# Patient Record
Sex: Female | Born: 1960 | Race: White | Hispanic: No | Marital: Married | Smoking: Former smoker
Health system: Southern US, Community
[De-identification: ages and names within clinical notes are randomized; demographics above are authoritative.]

## PROBLEM LIST (undated history)

## (undated) DIAGNOSIS — R51 Headache: Secondary | ICD-10-CM

## (undated) DIAGNOSIS — E785 Hyperlipidemia, unspecified: Secondary | ICD-10-CM

## (undated) DIAGNOSIS — F32A Depression, unspecified: Secondary | ICD-10-CM

## (undated) DIAGNOSIS — R519 Headache, unspecified: Secondary | ICD-10-CM

## (undated) DIAGNOSIS — F329 Major depressive disorder, single episode, unspecified: Secondary | ICD-10-CM

## (undated) DIAGNOSIS — C801 Malignant (primary) neoplasm, unspecified: Secondary | ICD-10-CM

## (undated) DIAGNOSIS — T753XXA Motion sickness, initial encounter: Secondary | ICD-10-CM

## (undated) DIAGNOSIS — K219 Gastro-esophageal reflux disease without esophagitis: Secondary | ICD-10-CM

## (undated) DIAGNOSIS — J302 Other seasonal allergic rhinitis: Secondary | ICD-10-CM

## (undated) DIAGNOSIS — M1990 Unspecified osteoarthritis, unspecified site: Secondary | ICD-10-CM

## (undated) DIAGNOSIS — R229 Localized swelling, mass and lump, unspecified: Secondary | ICD-10-CM

## (undated) DIAGNOSIS — F40240 Claustrophobia: Secondary | ICD-10-CM

## (undated) HISTORY — PX: BREAST BIOPSY: SHX20

## (undated) HISTORY — PX: BREAST CYST EXCISION: SHX579

## (undated) HISTORY — PX: DILATION AND CURETTAGE OF UTERUS: SHX78

## (undated) HISTORY — PX: BREAST EXCISIONAL BIOPSY: SUR124

## (undated) HISTORY — PX: WISDOM TOOTH EXTRACTION: SHX21

---

## 2011-03-15 DIAGNOSIS — E669 Obesity, unspecified: Secondary | ICD-10-CM | POA: Insufficient documentation

## 2011-03-15 DIAGNOSIS — E7800 Pure hypercholesterolemia, unspecified: Secondary | ICD-10-CM | POA: Insufficient documentation

## 2013-12-30 DIAGNOSIS — F40298 Other specified phobia: Secondary | ICD-10-CM | POA: Insufficient documentation

## 2014-04-02 ENCOUNTER — Ambulatory Visit: Payer: Self-pay | Admitting: Unknown Physician Specialty

## 2014-07-22 ENCOUNTER — Other Ambulatory Visit (HOSPITAL_COMMUNITY): Payer: Self-pay | Admitting: Unknown Physician Specialty

## 2014-07-22 ENCOUNTER — Other Ambulatory Visit (HOSPITAL_COMMUNITY): Payer: Self-pay | Admitting: Orthopedic Surgery

## 2014-07-22 DIAGNOSIS — M898X2 Other specified disorders of bone, upper arm: Secondary | ICD-10-CM

## 2014-07-22 DIAGNOSIS — M79622 Pain in left upper arm: Secondary | ICD-10-CM

## 2014-09-16 ENCOUNTER — Encounter (HOSPITAL_COMMUNITY): Payer: Self-pay | Admitting: *Deleted

## 2014-09-16 ENCOUNTER — Encounter (HOSPITAL_COMMUNITY)
Admission: RE | Admit: 2014-09-16 | Discharge: 2014-09-16 | Disposition: A | Discharge status: Home / Self Care | Payer: Worker's Compensation | Source: Ambulatory Visit | Attending: Interventional Radiology | Admitting: Interventional Radiology

## 2014-09-16 DIAGNOSIS — Z87891 Personal history of nicotine dependence: Secondary | ICD-10-CM | POA: Diagnosis not present

## 2014-09-16 DIAGNOSIS — M79622 Pain in left upper arm: Secondary | ICD-10-CM | POA: Diagnosis not present

## 2014-09-23 ENCOUNTER — Ambulatory Visit (HOSPITAL_COMMUNITY): Payer: Worker's Compensation | Admitting: Certified Registered"

## 2014-09-23 ENCOUNTER — Encounter (HOSPITAL_COMMUNITY)
Admission: RE | Disposition: A | Discharge status: Home / Self Care | Payer: Self-pay | Source: Ambulatory Visit | Attending: *Deleted

## 2014-09-23 ENCOUNTER — Ambulatory Visit (HOSPITAL_COMMUNITY)
Admission: RE | Admit: 2014-09-23 | Discharge: 2014-09-23 | Disposition: A | Discharge status: Home / Self Care | Payer: Worker's Compensation | Source: Ambulatory Visit | Attending: Unknown Physician Specialty | Admitting: Unknown Physician Specialty

## 2014-09-23 ENCOUNTER — Ambulatory Visit (HOSPITAL_COMMUNITY)
Admission: RE | Admit: 2014-09-23 | Discharge: 2014-09-23 | Disposition: A | Discharge status: Home / Self Care | Payer: Worker's Compensation | Source: Ambulatory Visit | Attending: Anesthesiology | Admitting: Anesthesiology

## 2014-09-23 ENCOUNTER — Encounter (HOSPITAL_COMMUNITY): Payer: Self-pay | Admitting: *Deleted

## 2014-09-23 DIAGNOSIS — M79622 Pain in left upper arm: Secondary | ICD-10-CM | POA: Diagnosis not present

## 2014-09-23 DIAGNOSIS — Z87891 Personal history of nicotine dependence: Secondary | ICD-10-CM | POA: Diagnosis not present

## 2014-09-23 DIAGNOSIS — M898X2 Other specified disorders of bone, upper arm: Secondary | ICD-10-CM

## 2014-09-23 HISTORY — DX: Localized swelling, mass and lump, unspecified: R22.9

## 2014-09-23 HISTORY — PX: RADIOLOGY WITH ANESTHESIA: SHX6223

## 2014-09-23 HISTORY — DX: Headache: R51

## 2014-09-23 HISTORY — DX: Major depressive disorder, single episode, unspecified: F32.9

## 2014-09-23 HISTORY — DX: Gastro-esophageal reflux disease without esophagitis: K21.9

## 2014-09-23 HISTORY — DX: Headache, unspecified: R51.9

## 2014-09-23 HISTORY — DX: Depression, unspecified: F32.A

## 2014-09-23 SURGERY — RADIOLOGY WITH ANESTHESIA
Anesthesia: General

## 2014-09-23 MED ORDER — ARTIFICIAL TEARS OP OINT
TOPICAL_OINTMENT | OPHTHALMIC | Status: DC | PRN
  Administered 2014-09-23: 1 via OPHTHALMIC

## 2014-09-23 MED ORDER — LIDOCAINE HCL (CARDIAC) 20 MG/ML IV SOLN
INTRAVENOUS | Status: DC | PRN
Start: 2014-09-23 — End: 2014-09-23
  Administered 2014-09-23: 50 mg via INTRAVENOUS

## 2014-09-23 MED ORDER — GADOBENATE DIMEGLUMINE 529 MG/ML IV SOLN
15.0000 mL | Freq: Once | INTRAVENOUS | Status: AC | PRN
  Administered 2014-09-23: 15 mL via INTRAVENOUS

## 2014-09-23 MED ORDER — PHENYLEPHRINE HCL 10 MG/ML IJ SOLN
INTRAMUSCULAR | Status: DC | PRN
  Administered 2014-09-23: 120 ug via INTRAVENOUS
  Administered 2014-09-23: 80 ug via INTRAVENOUS
  Administered 2014-09-23: 120 ug via INTRAVENOUS
  Administered 2014-09-23: 80 ug via INTRAVENOUS

## 2014-09-23 MED ORDER — MIDAZOLAM HCL 5 MG/5ML IJ SOLN
INTRAMUSCULAR | Status: DC | PRN
  Administered 2014-09-23: 2 mg via INTRAVENOUS

## 2014-09-23 MED ORDER — ONDANSETRON HCL 4 MG/2ML IJ SOLN
INTRAMUSCULAR | Status: DC | PRN
  Administered 2014-09-23: 4 mg via INTRAVENOUS

## 2014-09-23 MED ORDER — PROPOFOL 10 MG/ML IV BOLUS
INTRAVENOUS | Status: DC | PRN
  Administered 2014-09-23: 200 mg via INTRAVENOUS

## 2014-09-23 MED ORDER — FENTANYL CITRATE 0.05 MG/ML IJ SOLN
INTRAMUSCULAR | Status: DC | PRN
  Administered 2014-09-23: 50 ug via INTRAVENOUS

## 2014-09-23 MED ORDER — LACTATED RINGERS IV SOLN
INTRAVENOUS | Status: DC | PRN
  Administered 2014-09-23: 07:00:00 via INTRAVENOUS

## 2014-09-23 MED ORDER — EPHEDRINE SULFATE 50 MG/ML IJ SOLN
INTRAMUSCULAR | Status: DC | PRN
  Administered 2014-09-23 (×2): 10 mg via INTRAVENOUS

## 2014-09-23 NOTE — Anesthesia Preprocedure Evaluation (Addendum)
Anesthesia Evaluation  Patient identified by MRN, date of birth, ID band Patient awake    Reviewed: Allergy & Precautions, NPO status , Patient's Chart, lab work & pertinent test results  Airway Mallampati: II  TM Distance: >3 FB     Dental  (+) Teeth Intact, Dental Advisory Given, Missing,    Pulmonary former smoker,  breath sounds clear to auscultation        Cardiovascular negative cardio ROS  Rhythm:Regular Rate:Normal     Neuro/Psych  Headaches,    GI/Hepatic Neg liver ROS, GERD-  Medicated and Controlled,  Endo/Other  negative endocrine ROS  Renal/GU negative Renal ROS     Musculoskeletal   Abdominal   Peds  Hematology negative hematology ROS (+)   Anesthesia Other Findings   Reproductive/Obstetrics                            Anesthesia Physical Anesthesia Plan  ASA: II  Anesthesia Plan: General   Post-op Pain Management:    Induction: Intravenous  Airway Management Planned: Oral ETT and LMA  Additional Equipment:   Intra-op Plan:   Post-operative Plan: Extubation in OR  Informed Consent: I have reviewed the patients History and Physical, chart, labs and discussed the procedure including the risks, benefits and alternatives for the proposed anesthesia with the patient or authorized representative who has indicated his/her understanding and acceptance.   Dental advisory given  Plan Discussed with: CRNA and Surgeon  Anesthesia Plan Comments:         Anesthesia Quick Evaluation

## 2014-09-23 NOTE — Transfer of Care (Signed)
Immediate Anesthesia Transfer of Care Note  Patient: Rebecca Cobb  Procedure(s) Performed: Procedure(s): MRI (N/A)  Patient Location: PACU  Anesthesia Type:General  Level of Consciousness: awake, alert  and oriented  Airway & Oxygen Therapy: Patient Spontanous Breathing and Patient connected to nasal cannula oxygen  Post-op Assessment: Report given to PACU RN  Post vital signs: Reviewed and stable  Complications: No apparent anesthesia complications

## 2014-09-23 NOTE — Discharge Instructions (Signed)
General Anesthesia, Adult, Care After  °Refer to this sheet in the next few weeks. These instructions provide you with information on caring for yourself after your procedure. Your health care provider may also give you more specific instructions. Your treatment has been planned according to current medical practices, but problems sometimes occur. Call your health care provider if you have any problems or questions after your procedure.  °WHAT TO EXPECT AFTER THE PROCEDURE  °After the procedure, it is typical to experience:  °Sleepiness.  °Nausea and vomiting. °HOME CARE INSTRUCTIONS  °For the first 24 hours after general anesthesia:  °Have a responsible person with you.  °Do not drive a car. If you are alone, do not take public transportation.  °Do not drink alcohol.  °Do not take medicine that has not been prescribed by your health care provider.  °Do not sign important papers or make important decisions.  °You may resume a normal diet and activities as directed by your health care provider.  °Change bandages (dressings) as directed.  °If you have questions or problems that seem related to general anesthesia, call the hospital and ask for the anesthetist or anesthesiologist on call. °SEEK MEDICAL CARE IF:  °You have nausea and vomiting that continue the day after anesthesia.  °You develop a rash. °SEEK IMMEDIATE MEDICAL CARE IF:  °You have difficulty breathing.  °You have chest pain.  °You have any allergic problems. °Document Released: 12/05/2000 Document Revised: 05/01/2013 Document Reviewed: 03/14/2013  °[employer]® Patient Information ©2014 [employer].  ° °What to eat: ° °For your first meals, you should eat lightly; only small meals initially.  If you do not have nausea, you may eat larger meals.  Avoid spicy, greasy and heavy food.   ° °

## 2014-09-23 NOTE — Anesthesia Procedure Notes (Signed)
Procedure Name: LMA Insertion Date/Time: 09/23/2014 7:56 AM Performed by: Sampson Si E Pre-anesthesia Checklist: Patient identified, Emergency Drugs available, Suction available, Patient being monitored and Timeout performed Patient Re-evaluated:Patient Re-evaluated prior to inductionOxygen Delivery Method: Circle system utilized Preoxygenation: Pre-oxygenation with 100% oxygen Intubation Type: IV induction Ventilation: Mask ventilation without difficulty LMA: LMA inserted LMA Size: 4.0 Number of attempts: 1 Placement Confirmation: positive ETCO2 and breath sounds checked- equal and bilateral Tube secured with: Tape Dental Injury: Teeth and Oropharynx as per pre-operative assessment

## 2014-09-23 NOTE — Anesthesia Postprocedure Evaluation (Signed)
  Anesthesia Post-op Note  Patient: Rebecca Cobb  Procedure(s) Performed: Procedure(s): MRI (N/A)  Patient Location: PACU  Anesthesia Type:General  Level of Consciousness: awake and alert   Airway and Oxygen Therapy: Patient Spontanous Breathing  Post-op Pain: none  Post-op Assessment: Post-op Vital signs reviewed  Post-op Vital Signs: stable  Last Vitals:  Filed Vitals:   09/23/14 0945  BP: 145/77  Pulse: 97  Temp:   Resp: 18    Complications: No apparent anesthesia complications

## 2014-09-24 ENCOUNTER — Encounter (HOSPITAL_COMMUNITY): Payer: Self-pay | Admitting: Radiology

## 2014-10-08 DIAGNOSIS — M7542 Impingement syndrome of left shoulder: Secondary | ICD-10-CM | POA: Insufficient documentation

## 2014-12-16 ENCOUNTER — Other Ambulatory Visit (HOSPITAL_COMMUNITY): Payer: Self-pay | Admitting: Unknown Physician Specialty

## 2014-12-16 DIAGNOSIS — M7542 Impingement syndrome of left shoulder: Secondary | ICD-10-CM

## 2015-01-05 ENCOUNTER — Encounter (HOSPITAL_COMMUNITY): Payer: Self-pay

## 2015-01-05 ENCOUNTER — Encounter (HOSPITAL_COMMUNITY)
Admission: RE | Admit: 2015-01-05 | Discharge: 2015-01-05 | Disposition: A | Discharge status: Home / Self Care | Payer: Worker's Compensation | Source: Ambulatory Visit | Attending: Unknown Physician Specialty | Admitting: Unknown Physician Specialty

## 2015-01-05 DIAGNOSIS — M7542 Impingement syndrome of left shoulder: Secondary | ICD-10-CM | POA: Diagnosis not present

## 2015-01-05 HISTORY — DX: Claustrophobia: F40.240

## 2015-01-05 LAB — CBC
HCT: 39.8 % (ref 36.0–46.0)
Hemoglobin: 13.5 g/dL (ref 12.0–15.0)
MCH: 31.1 pg (ref 26.0–34.0)
MCHC: 33.9 g/dL (ref 30.0–36.0)
MCV: 91.7 fL (ref 78.0–100.0)
Platelets: 212 10*3/uL (ref 150–400)
RBC: 4.34 MIL/uL (ref 3.87–5.11)
RDW: 13.2 % (ref 11.5–15.5)
WBC: 4.9 10*3/uL (ref 4.0–10.5)

## 2015-01-05 NOTE — Progress Notes (Signed)
   01/05/15 1105  OBSTRUCTIVE SLEEP APNEA  Have you ever been diagnosed with sleep apnea through a sleep study? No  Do you snore loudly (loud enough to be heard through closed doors)?  0  Do you often feel tired, fatigued, or sleepy during the daytime? 0  Has anyone observed you stop breathing during your sleep? 0  Do you have, or are you being treated for high blood pressure? 0  BMI more than 35 kg/m2? 0  Age over 54 years old? 1  Neck circumference greater than 40 cm/16 inches? 0  Gender: 0

## 2015-01-05 NOTE — Pre-Procedure Instructions (Signed)
Rebecca Cobb  01/05/2015   Your procedure is scheduled on:  Thursday, April 28  Report to [facility] Admitting at 0600 AM.  Call this number if you have problems the morning of surgery: [phone]   Remember:   Do not eat food or drink liquids after midnight. Wednesday night   Take these medicines the morning of surgery with A SIP OF WATER: pain medication if needed   Do not wear jewelry, make-up or nail polish.  Do not wear lotions, powders, or perfumes. You may wear deodorant.  Do not shave 48 hours prior to surgery.    Do not bring valuables to the hospital.  [facility] is not responsible   for any belongings or valuables.               Contacts, dentures or bridgework may not be worn into surgery.                     Patients discharged the day of surgery will not be allowed to drive  home.  Name and phone number of your driver: spouse, Rebecca Cobb [phone]      Please read over the following fact sheets that you were given: Pain Booklet, Coughing and Deep Breathing and Surgical Site Infection Prevention

## 2015-01-08 ENCOUNTER — Ambulatory Visit (HOSPITAL_COMMUNITY): Payer: Worker's Compensation | Admitting: Certified Registered"

## 2015-01-08 ENCOUNTER — Encounter (HOSPITAL_COMMUNITY): Payer: Self-pay | Admitting: *Deleted

## 2015-01-08 ENCOUNTER — Ambulatory Visit (HOSPITAL_COMMUNITY)
Admission: RE | Admit: 2015-01-08 | Discharge: 2015-01-08 | Disposition: A | Discharge status: Home / Self Care | Payer: Worker's Compensation | Source: Ambulatory Visit | Attending: Unknown Physician Specialty | Admitting: Unknown Physician Specialty

## 2015-01-08 ENCOUNTER — Encounter (HOSPITAL_COMMUNITY)
Admission: RE | Disposition: A | Discharge status: Home / Self Care | Payer: Self-pay | Source: Ambulatory Visit | Attending: Unknown Physician Specialty

## 2015-01-08 DIAGNOSIS — M7542 Impingement syndrome of left shoulder: Secondary | ICD-10-CM | POA: Diagnosis present

## 2015-01-08 HISTORY — PX: RADIOLOGY WITH ANESTHESIA: SHX6223

## 2015-01-08 SURGERY — RADIOLOGY WITH ANESTHESIA
Anesthesia: General | Laterality: Left

## 2015-01-08 MED ORDER — PROPOFOL 10 MG/ML IV BOLUS
INTRAVENOUS | Status: DC | PRN
  Administered 2015-01-08: 150 mg via INTRAVENOUS

## 2015-01-08 MED ORDER — LIDOCAINE HCL (CARDIAC) 20 MG/ML IV SOLN
INTRAVENOUS | Status: DC | PRN
  Administered 2015-01-08: 100 mg via INTRAVENOUS

## 2015-01-08 MED ORDER — LACTATED RINGERS IV SOLN
INTRAVENOUS | Status: DC | PRN
  Administered 2015-01-08: 08:00:00 via INTRAVENOUS

## 2015-01-08 MED ORDER — MIDAZOLAM HCL 5 MG/5ML IJ SOLN
INTRAMUSCULAR | Status: DC | PRN
  Administered 2015-01-08 (×2): 2 mg via INTRAVENOUS

## 2015-01-08 NOTE — Anesthesia Procedure Notes (Signed)
Procedure Name: LMA Insertion Date/Time: 01/08/2015 8:01 AM Performed by: Melina Copa, Mavis Gravelle R Pre-anesthesia Checklist: Patient identified, Emergency Drugs available, Suction available, Patient being monitored and Timeout performed Patient Re-evaluated:Patient Re-evaluated prior to inductionOxygen Delivery Method: Circle system utilized Preoxygenation: Pre-oxygenation with 100% oxygen Intubation Type: IV induction Ventilation: Mask ventilation without difficulty LMA: LMA inserted LMA Size: 4.0 Placement Confirmation: positive ETCO2 Tube secured with: Tape Dental Injury: Teeth and Oropharynx as per pre-operative assessment

## 2015-01-08 NOTE — Anesthesia Preprocedure Evaluation (Signed)
Anesthesia Evaluation  Patient identified by MRN, date of birth, ID band Patient awake    Reviewed: Allergy & Precautions, NPO status , Patient's Chart, lab work & pertinent test results  Airway Mallampati: II  TM Distance: >3 FB     Dental  (+) Teeth Intact, Dental Advisory Given, Missing   Pulmonary former smoker,  breath sounds clear to auscultation        Cardiovascular negative cardio ROS  Rhythm:Regular Rate:Normal     Neuro/Psych  Headaches,    GI/Hepatic Neg liver ROS, GERD-  Medicated and Controlled,  Endo/Other  negative endocrine ROS  Renal/GU      Musculoskeletal   Abdominal   Peds  Hematology negative hematology ROS (+)   Anesthesia Other Findings   Reproductive/Obstetrics                             Anesthesia Physical Anesthesia Plan  ASA: I  Anesthesia Plan: General LMA   Post-op Pain Management:    Induction: Intravenous  Airway Management Planned: LMA  Additional Equipment:   Intra-op Plan:   Post-operative Plan: Extubation in OR  Informed Consent: I have reviewed the patients History and Physical, chart, labs and discussed the procedure including the risks, benefits and alternatives for the proposed anesthesia with the patient or authorized representative who has indicated his/her understanding and acceptance.   Dental advisory given  Plan Discussed with: CRNA and Surgeon  Anesthesia Plan Comments:         Anesthesia Quick Evaluation

## 2015-01-08 NOTE — Transfer of Care (Signed)
Immediate Anesthesia Transfer of Care Note  Patient: Rebecca Cobb  Procedure(s) Performed: Procedure(s) with comments: RADIOLOGY WITH ANESTHESIA/LEFT SHOULDER MRI (Left) - DR. KERNODLE/MRI  Patient Location: PACU  Anesthesia Type:General  Level of Consciousness: awake  Airway & Oxygen Therapy: Patient Spontanous Breathing  Post-op Assessment: Report given to RN, Post -op Vital signs reviewed and stable and Patient moving all extremities  Post vital signs: Reviewed and stable  Last Vitals:  Filed Vitals:   01/08/15 0926  BP: 125/83  Pulse: 66  Temp: 36.7 C  Resp: 14    Complications: No apparent anesthesia complications

## 2015-01-08 NOTE — Anesthesia Postprocedure Evaluation (Signed)
  Anesthesia Post-op Note  Patient: Rebecca Cobb  Procedure(s) Performed: Procedure(s) with comments: RADIOLOGY WITH ANESTHESIA/LEFT SHOULDER MRI (Left) - DR. KERNODLE/MRI  Patient Location: PACU  Anesthesia Type:General  Level of Consciousness: awake and alert   Airway and Oxygen Therapy: Patient Spontanous Breathing  Post-op Pain: none  Post-op Assessment: Post-op Vital signs reviewed  Post-op Vital Signs: stable  Last Vitals:  Filed Vitals:   01/08/15 0926  BP: 125/83  Pulse: 66  Temp: 36.7 C  Resp: 14    Complications: No apparent anesthesia complications

## 2015-01-09 ENCOUNTER — Encounter (HOSPITAL_COMMUNITY): Payer: Self-pay | Admitting: Radiology

## 2015-01-21 DIAGNOSIS — M75112 Incomplete rotator cuff tear or rupture of left shoulder, not specified as traumatic: Secondary | ICD-10-CM | POA: Insufficient documentation

## 2015-03-02 ENCOUNTER — Encounter: Payer: Self-pay | Admitting: *Deleted

## 2015-03-06 ENCOUNTER — Encounter: Payer: Self-pay | Admitting: *Deleted

## 2015-03-06 ENCOUNTER — Ambulatory Visit: Payer: Worker's Compensation | Admitting: Anesthesiology

## 2015-03-06 ENCOUNTER — Encounter
Admission: RE | Disposition: A | Discharge status: Home / Self Care | Payer: Self-pay | Source: Ambulatory Visit | Attending: Unknown Physician Specialty

## 2015-03-06 ENCOUNTER — Ambulatory Visit
Admission: RE | Admit: 2015-03-06 | Discharge: 2015-03-06 | Disposition: A | Discharge status: Home / Self Care | Payer: Worker's Compensation | Source: Ambulatory Visit | Attending: Unknown Physician Specialty | Admitting: Unknown Physician Specialty

## 2015-03-06 DIAGNOSIS — Z8249 Family history of ischemic heart disease and other diseases of the circulatory system: Secondary | ICD-10-CM | POA: Diagnosis not present

## 2015-03-06 DIAGNOSIS — Z823 Family history of stroke: Secondary | ICD-10-CM | POA: Insufficient documentation

## 2015-03-06 DIAGNOSIS — E669 Obesity, unspecified: Secondary | ICD-10-CM | POA: Insufficient documentation

## 2015-03-06 DIAGNOSIS — M75112 Incomplete rotator cuff tear or rupture of left shoulder, not specified as traumatic: Secondary | ICD-10-CM | POA: Diagnosis present

## 2015-03-06 DIAGNOSIS — Z8042 Family history of malignant neoplasm of prostate: Secondary | ICD-10-CM | POA: Insufficient documentation

## 2015-03-06 DIAGNOSIS — M7542 Impingement syndrome of left shoulder: Secondary | ICD-10-CM | POA: Diagnosis not present

## 2015-03-06 DIAGNOSIS — E785 Hyperlipidemia, unspecified: Secondary | ICD-10-CM | POA: Insufficient documentation

## 2015-03-06 DIAGNOSIS — Z885 Allergy status to narcotic agent status: Secondary | ICD-10-CM | POA: Insufficient documentation

## 2015-03-06 DIAGNOSIS — F329 Major depressive disorder, single episode, unspecified: Secondary | ICD-10-CM | POA: Diagnosis not present

## 2015-03-06 DIAGNOSIS — Z808 Family history of malignant neoplasm of other organs or systems: Secondary | ICD-10-CM | POA: Insufficient documentation

## 2015-03-06 DIAGNOSIS — M7522 Bicipital tendinitis, left shoulder: Secondary | ICD-10-CM | POA: Insufficient documentation

## 2015-03-06 DIAGNOSIS — Z8489 Family history of other specified conditions: Secondary | ICD-10-CM | POA: Diagnosis not present

## 2015-03-06 DIAGNOSIS — Z87891 Personal history of nicotine dependence: Secondary | ICD-10-CM | POA: Diagnosis not present

## 2015-03-06 DIAGNOSIS — Z6831 Body mass index (BMI) 31.0-31.9, adult: Secondary | ICD-10-CM | POA: Diagnosis not present

## 2015-03-06 DIAGNOSIS — Z79899 Other long term (current) drug therapy: Secondary | ICD-10-CM | POA: Diagnosis not present

## 2015-03-06 HISTORY — DX: Motion sickness, initial encounter: T75.3XXA

## 2015-03-06 HISTORY — PX: SHOULDER ARTHROSCOPY WITH OPEN ROTATOR CUFF REPAIR AND DISTAL CLAVICLE ACROMINECTOMY: SHX5683

## 2015-03-06 SURGERY — SHOULDER ARTHROSCOPY WITH OPEN ROTATOR CUFF REPAIR AND DISTAL CLAVICLE ACROMINECTOMY
Anesthesia: General | Laterality: Left | Wound class: Clean

## 2015-03-06 MED ORDER — PROPOFOL 10 MG/ML IV BOLUS
INTRAVENOUS | Status: DC | PRN
  Administered 2015-03-06: 160 mg via INTRAVENOUS

## 2015-03-06 MED ORDER — LACTATED RINGERS IV SOLN
INTRAVENOUS | Status: DC | PRN
  Administered 2015-03-06: 9100 mL

## 2015-03-06 MED ORDER — GLYCOPYRROLATE 0.2 MG/ML IJ SOLN
INTRAMUSCULAR | Status: DC | PRN
  Administered 2015-03-06: 0.1 mg via INTRAVENOUS

## 2015-03-06 MED ORDER — CEFAZOLIN SODIUM 1-5 GM-% IV SOLN
INTRAVENOUS | Status: DC | PRN
  Administered 2015-03-06: 1 g via INTRAVENOUS

## 2015-03-06 MED ORDER — MIDAZOLAM HCL 5 MG/5ML IJ SOLN
INTRAMUSCULAR | Status: DC | PRN
  Administered 2015-03-06 (×3): 2 mg via INTRAVENOUS

## 2015-03-06 MED ORDER — ONDANSETRON HCL 4 MG/2ML IJ SOLN
INTRAMUSCULAR | Status: DC | PRN
  Administered 2015-03-06: 4 mg via INTRAVENOUS

## 2015-03-06 MED ORDER — SCOPOLAMINE 1 MG/3DAYS TD PT72
1.0000 | MEDICATED_PATCH | TRANSDERMAL | Status: DC
Start: 2015-03-06 — End: 2015-03-06
  Administered 2015-03-06: 1.5 mg via TRANSDERMAL

## 2015-03-06 MED ORDER — ROPIVACAINE HCL 5 MG/ML IJ SOLN
INTRAMUSCULAR | Status: DC | PRN
  Administered 2015-03-06: 30 mL via PERINEURAL

## 2015-03-06 MED ORDER — FENTANYL CITRATE (PF) 100 MCG/2ML IJ SOLN
INTRAMUSCULAR | Status: DC | PRN
  Administered 2015-03-06: 100 ug via INTRAVENOUS

## 2015-03-06 MED ORDER — DEXAMETHASONE SODIUM PHOSPHATE 4 MG/ML IJ SOLN
INTRAMUSCULAR | Status: DC | PRN
  Administered 2015-03-06: 4 mg via PERINEURAL
  Administered 2015-03-06: 4 mg via INTRAVENOUS

## 2015-03-06 MED ORDER — LACTATED RINGERS IV SOLN
INTRAVENOUS | Status: DC
  Administered 2015-03-06: 08:00:00 via INTRAVENOUS

## 2015-03-06 MED ORDER — LIDOCAINE HCL (CARDIAC) 20 MG/ML IV SOLN
INTRAVENOUS | Status: DC | PRN
  Administered 2015-03-06: 50 mg via INTRATRACHEAL

## 2015-03-06 SURGICAL SUPPLY — 71 items
ADAPTER IRRIG TUBE 2 SPIKE SOL (ADAPTER) ×4 IMPLANT
ARTHROWAND PARAGON T2 (SURGICAL WAND)
BLADE SURG 15 STRL LF DISP TIS (BLADE) IMPLANT
BLADE SURG 15 STRL SS (BLADE)
BUR BR 5.5 12 FLUTE (BURR) ×2 IMPLANT
BUR HOODED 3.0 ABRASION (BURR) IMPLANT
BUR RADIUS 4.0X18.5 (BURR) ×2 IMPLANT
BUR RADIUS 5.5 (BURR) IMPLANT
CANNULA 8.5X75 THRED (CANNULA) ×2 IMPLANT
CANNULA SHAVER 8MMX76MM (CANNULA) IMPLANT
CAP LOCK ULTRA CANNULA (MISCELLANEOUS) IMPLANT
CUTTER CANN W/HOLE 4.5 (CUTTER) IMPLANT
CUTTER SLOTTED WHISKER 4.0 (BURR) IMPLANT
DRAPE STERI 35X30 U-POUCH (DRAPES) ×2 IMPLANT
DURAPREP 26ML APPLICATOR (WOUND CARE) ×4 IMPLANT
GAUZE SPONGE 4X4 12PLY STRL (GAUZE/BANDAGES/DRESSINGS) ×2 IMPLANT
GLOVE BIO SURGEON STRL SZ7.5 (GLOVE) ×6 IMPLANT
GLOVE BIO SURGEON STRL SZ8 (GLOVE) ×2 IMPLANT
GLOVE INDICATOR 8.0 STRL GRN (GLOVE) ×4 IMPLANT
GOWN STRL REIN 2XL LVL4 (GOWN DISPOSABLE) IMPLANT
GOWN STRL REUS W/ TWL LRG LVL3 (GOWN DISPOSABLE) ×3 IMPLANT
GOWN STRL REUS W/TWL LRG LVL3 (GOWN DISPOSABLE) ×3
IV LACTATED RINGER IRRG 3000ML (IV SOLUTION) ×4
IV LR IRRIG 3000ML ARTHROMATIC (IV SOLUTION) ×4 IMPLANT
KIT SHOULDER TRACTION (DRAPES) ×2 IMPLANT
MANIFOLD 4PT FOR NEPTUNE1 (MISCELLANEOUS) ×2 IMPLANT
NEEDLE 18GX1X1/2 (RX/OR ONLY) (NEEDLE) ×2 IMPLANT
NEEDLE MAYO CATGUT SZ 1.5 (NEEDLE)
NEEDLE MAYO CATGUT SZ 2 (NEEDLE) IMPLANT
NEEDLE SPNL 18GX3.5 QUINCKE PK (NEEDLE) IMPLANT
PACK ARTHROSCOPY SHOULDER (MISCELLANEOUS) ×2 IMPLANT
PAD GROUND ADULT SPLIT (MISCELLANEOUS) IMPLANT
PASSER SUT CAPTURE FIRST (SUTURE) IMPLANT
SET TUBE SUCT SHAVER OUTFL 24K (TUBING) ×2 IMPLANT
SOL PREP PVP 2OZ (MISCELLANEOUS) ×2
SOLUTION PREP PVP 2OZ (MISCELLANEOUS) ×1 IMPLANT
STAPLER SKIN PROX 35W (STAPLE) IMPLANT
SUT ETHIBOND NAB CT1 #1 30IN (SUTURE) IMPLANT
SUT ETHILON 3-0 FS-10 30 BLK (SUTURE) ×2
SUT MAGNUM WIRE 2 (SUTURE) IMPLANT
SUT PDS AB 1 CT1 27 (SUTURE) IMPLANT
SUT PDSII 0 (SUTURE) IMPLANT
SUT PERFECTPASSER WHITE CART (SUTURE) IMPLANT
SUT PROLENE 2 0 CT2 30 (SUTURE) IMPLANT
SUT SMART STITCH CARTRIDGE (SUTURE) IMPLANT
SUT TICRON 2-0 30IN 311381 (SUTURE) IMPLANT
SUT VIC AB 0 CT1 36 (SUTURE) IMPLANT
SUT VIC AB 0 CT2 27 (SUTURE) IMPLANT
SUT VIC AB 2-0 CT1 27 (SUTURE)
SUT VIC AB 2-0 CT1 TAPERPNT 27 (SUTURE) IMPLANT
SUT VIC AB 2-0 CT2 27 (SUTURE) IMPLANT
SUT VIC AB 2-0 SH 27 (SUTURE)
SUT VIC AB 2-0 SH 27XBRD (SUTURE) IMPLANT
SUT VIC AB 3-0 SH 27 (SUTURE)
SUT VIC AB 3-0 SH 27X BRD (SUTURE) IMPLANT
SUTURE EHLN 3-0 FS-10 30 BLK (SUTURE) ×1 IMPLANT
SUTURE MAGNUM WIRE 2X48 BLK (SUTURE) IMPLANT
SYRINGE 10CC LL (SYRINGE) ×2 IMPLANT
TAPE MICROFOAM 4IN (TAPE) ×2 IMPLANT
TUBING ARTHRO INFLOW-ONLY STRL (TUBING) ×2 IMPLANT
WAND 30 DEG SABER W/CORD (SURGICAL WAND) IMPLANT
WAND ARTHRO PARAGON T2 (SURGICAL WAND) IMPLANT
WAND COVAC 50 IFS (MISCELLANEOUS) IMPLANT
WAND COVATOR 20 (MISCELLANEOUS) IMPLANT
WAND HAND CNTRL MULTIVAC 50 (MISCELLANEOUS) IMPLANT
WAND HAND CNTRL MULTIVAC 90 (MISCELLANEOUS) IMPLANT
WAND MEGAVAC 90 (MISCELLANEOUS) ×2 IMPLANT
WAND TENDON TOPAZ 0 ANGL (MISCELLANEOUS) IMPLANT
WAND TOPAZ EPF  WAS Q (MISCELLANEOUS)
WAND TOPAZ EPF WAS Q (MISCELLANEOUS) IMPLANT
WIRE MAGNUM (SUTURE) IMPLANT

## 2015-03-06 NOTE — Anesthesia Postprocedure Evaluation (Signed)
  Anesthesia Post-op Note  Patient: Rebecca Cobb  Procedure(s) Performed: Procedure(s): SHOULDER ARTHROSCOPY WITH RELEASE OF LONG HEAD OF BICEPS TENDON AND DECOMPRESSION (Left)  Anesthesia type:General  Patient location: PACU  Post pain: Pain level controlled  Post assessment: Post-op Vital signs reviewed, Patient's Cardiovascular Status Stable, Respiratory Function Stable, Patent Airway and No signs of Nausea or vomiting  Post vital signs: Reviewed and stable  Last Vitals:  Filed Vitals:   03/06/15 1043  BP: 110/73  Pulse: 66  Temp: 36.5 C  Resp: 16    Level of consciousness: awake, alert  and patient cooperative  Complications: No apparent anesthesia complications

## 2015-03-06 NOTE — Anesthesia Preprocedure Evaluation (Signed)
Anesthesia Evaluation  Patient identified by MRN, date of birth, ID band Patient awake    Reviewed: Allergy & Precautions, H&P , NPO status , Patient's Chart, lab work & pertinent test results, reviewed documented beta blocker date and time   Airway Mallampati: II  TM Distance: >3 FB Neck ROM: full    Dental no notable dental hx.    Pulmonary former smoker,  breath sounds clear to auscultation  Pulmonary exam normal       Cardiovascular Exercise Tolerance: Good negative cardio ROS  Rhythm:regular Rate:Normal     Neuro/Psych  Headaches, PSYCHIATRIC DISORDERS    GI/Hepatic Neg liver ROS, GERD-  Medicated and Controlled,  Endo/Other  negative endocrine ROS  Renal/GU negative Renal ROS  negative genitourinary   Musculoskeletal   Abdominal   Peds  Hematology negative hematology ROS (+)   Anesthesia Other Findings   Reproductive/Obstetrics negative OB ROS                             Anesthesia Physical Anesthesia Plan  ASA: II  Anesthesia Plan: General   Post-op Pain Management:    Induction:   Airway Management Planned:   Additional Equipment:   Intra-op Plan:   Post-operative Plan:   Informed Consent: I have reviewed the patients History and Physical, chart, labs and discussed the procedure including the risks, benefits and alternatives for the proposed anesthesia with the patient or authorized representative who has indicated his/her understanding and acceptance.     Plan Discussed with: CRNA  Anesthesia Plan Comments:         Anesthesia Quick Evaluation

## 2015-03-06 NOTE — H&P (Signed)
  H and P reviewed. No changes. Uploaded at later date. 

## 2015-03-06 NOTE — Anesthesia Procedure Notes (Addendum)
Anesthesia Regional Block:  Interscalene brachial plexus block  Pre-Anesthetic Checklist: ,, timeout performed, Correct Patient, Correct Site, Correct Laterality, Correct Procedure, Correct Position, site marked, Risks and benefits discussed,  Surgical consent,  Pre-op evaluation,  At surgeon's request and post-op pain management  Laterality: Left  Prep: chloraprep       Needles:  Injection technique: Single-shot     Needle Length: 4cm 4 cm Needle Gauge: 21 and 21 G    Additional Needles:  Procedures: ultrasound guided (picture in chart) Interscalene brachial plexus block Narrative:  Start time: 03/06/2015 8:27 AM End time: 03/06/2015 8:36 AM Injection made incrementally with aspirations every 30 mL.  Performed by: Personally  Anesthesiologist: Marchia Bond D   Procedure Name: LMA Insertion Date/Time: 03/06/2015 9:21 AM Performed by: Mayme Genta Pre-anesthesia Checklist: Patient identified, Emergency Drugs available, Suction available, Timeout performed and Patient being monitored Patient Re-evaluated:Patient Re-evaluated prior to inductionOxygen Delivery Method: Circle system utilized Preoxygenation: Pre-oxygenation with 100% oxygen Intubation Type: IV induction LMA: LMA inserted LMA Size: 4.0 Number of attempts: 1 Placement Confirmation: positive ETCO2 and breath sounds checked- equal and bilateral Tube secured with: Tape

## 2015-03-06 NOTE — Op Note (Signed)
03/06/2015  10:58 AM  Patient:   Rebecca Cobb  Pre-Op Diagnosis:   PARTIAL THICKNESS INFRASPINATUS CUFF TEAR LEFT M75.112  Postoperative diagnosis: Traumatic impingement left shoulder with bicipital tendinitis   Procedure: Arthroscopic release of the long head of biceps tendon followed by arthroscopic subacromial decompression left shoulder.  Anesthesia: General endotracheal with interscalene block placed preoperatively by the anesthesiologist.  Findings: As above.   Complications: None  Estimated blood loss: negligable  Tourniquet time: None  Drains: None     Brief clinical note:  The patient's symptoms have progressed despite medications, activity modification, etc. The patient's history and examination are consistent with impingement left shoulder with possible small rotator cuff tear. The MRI  was consistent with a possible small cuff tear. The patient presents at this time for definitive management of these shoulder symptoms.  Procedure: The patient was brought into the operating room and placed in the supine position. The patient then underwent general endotracheal intubation and anesthesia before being repositioned in the lateral decubitus position. The left shoulder and upper extremity were prepped and draped in usual fashion. Preoperative antibiotics were administered. A timeout was performed . A posterior portal was created and the glenohumeral joint thoroughly inspected revealing no significant degenerative changes. No obvious articular sided rotator cuff tear was appreciated. The labrum was intact but there was some fraying near the long head of the biceps tendon attachment to the labrum.  An anterior portal was created.   The ArthroCare wand was inserted and used to obtain hemostasis as well as to perform a limited synovectomy.The biceps tendon was evaluated and then released at its attachment to the labrum using the ArthroCare wand.  The scope was  repositioned through the posterior portal into the subacromial space. A separate lateral portal was created using an outside-in technique. The patient was noted to have significantly thickened subacromial bursal tissue. An ArthroCare 90 wand followed by a 3.5 full-radius resector was introduced and used to perform a subtotal bursectomy. The ArthroCare wand was then inserted and used to remove the periosteal tissue off the undersurface of the anterior third of the acromion as well as to recess the coracoacromial ligament from its attachment along the anterior and lateral margins of the acromion.   With the scope in the lateral portal a 5.51mm acromionizing bur was introduced through the posterior portal and used to perform the decompression by removing the undersurface of the anterior third of the acromion  The portal sites  were closed using 4-O nylon sutures. A sterile bulky dressing was applied to the shoulder before the arm was placed into a shoulder immobilizer. The patient was then awakened, extubated, and returned to the recovery room in satisfactory condition after tolerating the procedure well.  Blood loss was negligible.

## 2015-03-06 NOTE — Transfer of Care (Signed)
Immediate Anesthesia Transfer of Care Note  Patient: Rebecca Cobb  Procedure(s) Performed: Procedure(s): SHOULDER ARTHROSCOPY WITH RELEASE OF LONG HEAD OF BICEPS TENDON AND DECOMPRESSION (Left)  Patient Location: PACU  Anesthesia Type: General  Level of Consciousness: awake, alert  and patient cooperative  Airway and Oxygen Therapy: Patient Spontanous Breathing and Patient connected to supplemental oxygen  Post-op Assessment: Post-op Vital signs reviewed, Patient's Cardiovascular Status Stable, Respiratory Function Stable, Patent Airway and No signs of Nausea or vomiting  Post-op Vital Signs: Reviewed and stable  Complications: No apparent anesthesia complications

## 2015-03-06 NOTE — Progress Notes (Signed)
Assisted Marchia Bond ANMD with left, interscalene  block. Side rails up, monitors on throughout procedure. See vital signs in flow sheet. Tolerated Procedure well.

## 2015-03-06 NOTE — Discharge Instructions (Addendum)
°  General Anesthesia, Care After Refer to this sheet in the next few weeks. These instructions provide you with information on caring for yourself after your procedure. Your health care provider may also give you more specific instructions. Your treatment has been planned according to current medical practices, but problems sometimes occur. Call your health care provider if you have any problems or questions after your procedure. WHAT TO EXPECT AFTER THE PROCEDURE After the procedure, it is typical to experience:  Sleepiness.  Nausea and vomiting. HOME CARE INSTRUCTIONS  For the first 24 hours after general anesthesia:  Have a responsible person with you.  Do not drive a car. If you are alone, do not take public transportation.  Do not drink alcohol.  Do not take medicine that has not been prescribed by your health care provider.  Do not sign important papers or make important decisions.  You may resume a normal diet and activities as directed by your health care provider.  Change bandages (dressings) as directed.  If you have questions or problems that seem related to general anesthesia, call the hospital and ask for the anesthetist or anesthesiologist on call. SEEK MEDICAL CARE IF:  You have nausea and vomiting that continue the day after anesthesia.  You develop a rash. SEEK IMMEDIATE MEDICAL CARE IF:   You have difficulty breathing.  You have chest pain.  You have any allergic problems.  Document Released: 12/05/2000 Document Revised: 09/03/2013 Document Reviewed: 03/14/2013 [facility] Patient Information 2015 [place], [place]. This information is not intended to replace advice given to you by your health care provider. Make sure you discuss any questions you have with your health care provider. Diet: As you were doing prior to hospitalization   Shower:  May shower but keep the wounds dry, use an occlusive plastic wrap or extremity protector. NO SOAKING IN TUB.    Dressing:  You may remove your dressing 3 days after surgery. Then apply waterproof bandaids and change them after showering.   Activity:  Increase activity slowly as tolerated. Can drive when comfortable.    Sling: D/C when comfortable    RTC: 1 week  Ice pack as needed   To prevent constipation: you may use a stool softener such as - Miralax (over the counter) for constipation as needed.    To prevent venous clotting Take one 81 mg. ASA tablet  2X per day for about 2 weeks post surgery.  Precautions:  If you experience chest pain or shortness of breath - call 911 immediately for transfer to the hospital emergency department!!  If you develop a fever greater that 101 F, purulent drainage from wound, increased redness or drainage from wound, or calf pain -- Call the office at [phone]                                             Follow- Up Appointment:  Please call for an appointment to be seen in 1 wk.

## 2015-03-09 ENCOUNTER — Encounter: Payer: Self-pay | Admitting: Unknown Physician Specialty

## 2015-07-29 DIAGNOSIS — IMO0001 Reserved for inherently not codable concepts without codable children: Secondary | ICD-10-CM | POA: Insufficient documentation

## 2015-10-23 ENCOUNTER — Other Ambulatory Visit (HOSPITAL_COMMUNITY): Payer: Self-pay | Admitting: Orthopedic Surgery

## 2015-10-23 DIAGNOSIS — M75102 Unspecified rotator cuff tear or rupture of left shoulder, not specified as traumatic: Secondary | ICD-10-CM

## 2015-11-02 NOTE — Progress Notes (Signed)
Spoke with Velna Hatchet and requested that H&P be faxed over last office visit 10/18/15

## 2015-11-05 ENCOUNTER — Encounter (HOSPITAL_COMMUNITY): Payer: Self-pay

## 2015-11-05 ENCOUNTER — Encounter (HOSPITAL_COMMUNITY)
Admission: RE | Admit: 2015-11-05 | Discharge: 2015-11-05 | Disposition: A | Discharge status: Home / Self Care | Payer: Worker's Compensation | Source: Ambulatory Visit | Attending: Orthopedic Surgery | Admitting: Orthopedic Surgery

## 2015-11-05 DIAGNOSIS — M75102 Unspecified rotator cuff tear or rupture of left shoulder, not specified as traumatic: Secondary | ICD-10-CM | POA: Diagnosis not present

## 2015-11-05 DIAGNOSIS — Z01812 Encounter for preprocedural laboratory examination: Secondary | ICD-10-CM | POA: Diagnosis not present

## 2015-11-05 LAB — CBC
HEMATOCRIT: 40.3 % (ref 36.0–46.0)
HEMOGLOBIN: 13.6 g/dL (ref 12.0–15.0)
MCH: 31.6 pg (ref 26.0–34.0)
MCHC: 33.7 g/dL (ref 30.0–36.0)
MCV: 93.5 fL (ref 78.0–100.0)
Platelets: 199 10*3/uL (ref 150–400)
RBC: 4.31 MIL/uL (ref 3.87–5.11)
RDW: 12.9 % (ref 11.5–15.5)
WBC: 4.8 10*3/uL (ref 4.0–10.5)

## 2015-11-09 ENCOUNTER — Telehealth (HOSPITAL_COMMUNITY): Payer: Self-pay | Admitting: *Deleted

## 2015-11-10 ENCOUNTER — Ambulatory Visit (HOSPITAL_COMMUNITY)
Admission: RE | Admit: 2015-11-10 | Discharge: 2015-11-10 | Disposition: A | Discharge status: Home / Self Care | Payer: Worker's Compensation | Source: Ambulatory Visit | Attending: Orthopedic Surgery | Admitting: Orthopedic Surgery

## 2015-11-16 NOTE — Progress Notes (Signed)
Pt notified of time of arrival to Short Stay for MRI with anesthesia. Instructed pt to be here at 7:15 AM.

## 2015-11-17 ENCOUNTER — Ambulatory Visit (HOSPITAL_COMMUNITY)
Admission: RE | Admit: 2015-11-17 | Discharge: 2015-11-17 | Disposition: A | Discharge status: Home / Self Care | Payer: Worker's Compensation | Source: Ambulatory Visit | Attending: Anesthesiology | Admitting: Anesthesiology

## 2015-11-17 ENCOUNTER — Encounter (HOSPITAL_COMMUNITY): Payer: Self-pay | Admitting: Surgery

## 2015-11-17 ENCOUNTER — Ambulatory Visit (HOSPITAL_COMMUNITY): Payer: Worker's Compensation | Admitting: Anesthesiology

## 2015-11-17 ENCOUNTER — Encounter (HOSPITAL_COMMUNITY): Admission: RE | Disposition: A | Discharge status: Home / Self Care | Payer: Self-pay | Source: Ambulatory Visit

## 2015-11-17 DIAGNOSIS — M25512 Pain in left shoulder: Secondary | ICD-10-CM | POA: Diagnosis present

## 2015-11-17 DIAGNOSIS — M75102 Unspecified rotator cuff tear or rupture of left shoulder, not specified as traumatic: Secondary | ICD-10-CM

## 2015-11-17 DIAGNOSIS — Z87891 Personal history of nicotine dependence: Secondary | ICD-10-CM | POA: Diagnosis not present

## 2015-11-17 DIAGNOSIS — K219 Gastro-esophageal reflux disease without esophagitis: Secondary | ICD-10-CM | POA: Insufficient documentation

## 2015-11-17 DIAGNOSIS — M7592 Shoulder lesion, unspecified, left shoulder: Secondary | ICD-10-CM | POA: Diagnosis not present

## 2015-11-17 DIAGNOSIS — X58XXXA Exposure to other specified factors, initial encounter: Secondary | ICD-10-CM | POA: Insufficient documentation

## 2015-11-17 DIAGNOSIS — S46112A Strain of muscle, fascia and tendon of long head of biceps, left arm, initial encounter: Secondary | ICD-10-CM | POA: Insufficient documentation

## 2015-11-17 HISTORY — PX: RADIOLOGY WITH ANESTHESIA: SHX6223

## 2015-11-17 SURGERY — RADIOLOGY WITH ANESTHESIA
Anesthesia: General | Laterality: Left

## 2015-11-17 MED ORDER — HYDROMORPHONE HCL 1 MG/ML IJ SOLN
INTRAMUSCULAR | Status: AC
  Administered 2015-11-17: 0.5 mg via INTRAVENOUS
  Filled 2015-11-17: qty 1

## 2015-11-17 MED ORDER — HYDROMORPHONE HCL 1 MG/ML IJ SOLN
0.2500 mg | INTRAMUSCULAR | Status: DC | PRN
  Administered 2015-11-17 (×2): 0.5 mg via INTRAVENOUS

## 2015-11-17 MED ORDER — PROMETHAZINE HCL 25 MG/ML IJ SOLN
6.2500 mg | INTRAMUSCULAR | Status: DC | PRN
  Administered 2015-11-17: 6.25 mg via INTRAVENOUS

## 2015-11-17 MED ORDER — PROMETHAZINE HCL 25 MG/ML IJ SOLN
INTRAMUSCULAR | Status: AC
  Filled 2015-11-17: qty 1

## 2015-11-17 MED ORDER — LACTATED RINGERS IV SOLN
INTRAVENOUS | Status: DC
  Administered 2015-11-17: 08:00:00 via INTRAVENOUS

## 2015-11-17 NOTE — Anesthesia Postprocedure Evaluation (Signed)
Anesthesia Post Note  Patient: Rebecca Cobb  Procedure(s) Performed: Procedure(s) (LRB): LEFT SHOULDER MRI (Left)  Patient location during evaluation: PACU Anesthesia Type: General Level of consciousness: awake and alert Pain management: pain level controlled Vital Signs Assessment: post-procedure vital signs reviewed and stable Respiratory status: spontaneous breathing, nonlabored ventilation, respiratory function stable and patient connected to nasal cannula oxygen Cardiovascular status: blood pressure returned to baseline and stable Postop Assessment: no signs of nausea or vomiting Anesthetic complications: no    Last Vitals:  Filed Vitals:   11/17/15 0719 11/17/15 1240  BP: 133/69 127/80  Pulse: 70 81  Temp: 36.8 C 36.8 C  Resp: 20 14    Last Pain:  Filed Vitals:   11/17/15 1245  PainSc: 0-No pain                 Miki Blank,JAMES TERRILL

## 2015-11-17 NOTE — Anesthesia Preprocedure Evaluation (Addendum)
Anesthesia Evaluation  Patient identified by MRN, date of birth, ID band Patient awake    Airway Mallampati: I  TM Distance: >3 FB     Dental  (+) Teeth Intact, Dental Advisory Given   Pulmonary former smoker,    breath sounds clear to auscultation       Cardiovascular  Rhythm:Regular Rate:Normal     Neuro/Psych    GI/Hepatic GERD  ,  Endo/Other    Renal/GU      Musculoskeletal   Abdominal   Peds  Hematology   Anesthesia Other Findings   Reproductive/Obstetrics                            Anesthesia Physical Anesthesia Plan  ASA: I  Anesthesia Plan: General   Post-op Pain Management:    Induction: Intravenous  Airway Management Planned: LMA  Additional Equipment:   Intra-op Plan:   Post-operative Plan: Extubation in OR  Informed Consent: I have reviewed the patients History and Physical, chart, labs and discussed the procedure including the risks, benefits and alternatives for the proposed anesthesia with the patient or authorized representative who has indicated his/her understanding and acceptance.   Dental advisory given  Plan Discussed with: CRNA and Surgeon  Anesthesia Plan Comments:         Anesthesia Quick Evaluation

## 2015-11-17 NOTE — Transfer of Care (Signed)
Immediate Anesthesia Transfer of Care Note  Patient: Rebecca Cobb  Procedure(s) Performed: Procedure(s): LEFT SHOULDER MRI (Left)  Patient Location: PACU  Anesthesia Type:General  Level of Consciousness: awake, oriented and patient cooperative  Airway & Oxygen Therapy: Patient Spontanous Breathing and Patient connected to nasal cannula oxygen  Post-op Assessment: Report given to RN and Post -op Vital signs reviewed and stable  Post vital signs: Reviewed  Last Vitals:  Filed Vitals:   11/17/15 0719 11/17/15 1240  BP: 133/69 127/80  Pulse: 70 81  Temp: 36.8 C 36.8 C  Resp: 20 14    Complications: No apparent anesthesia complications

## 2015-11-18 ENCOUNTER — Encounter (HOSPITAL_COMMUNITY): Payer: Self-pay | Admitting: Radiology

## 2015-11-19 MED FILL — Propofol IV Emul 200 MG/20ML (10 MG/ML): INTRAVENOUS | Qty: 20 | Status: AC

## 2015-11-19 MED FILL — Dexamethasone Sodium Phosphate Inj 4 MG/ML: INTRAMUSCULAR | Qty: 1 | Status: AC

## 2015-11-19 MED FILL — Lactated Ringer's Solution: INTRAVENOUS | Qty: 1000 | Status: AC

## 2015-11-19 MED FILL — Fentanyl Citrate Preservative Free (PF) Inj 100 MCG/2ML: INTRAMUSCULAR | Qty: 2 | Status: AC

## 2015-11-19 MED FILL — Ondansetron HCl Inj 4 MG/2ML (2 MG/ML): INTRAMUSCULAR | Qty: 2 | Status: AC

## 2015-11-19 MED FILL — Lidocaine HCl IV Inj 20 MG/ML: INTRAVENOUS | Qty: 5 | Status: AC

## 2015-11-19 MED FILL — Midazolam HCl Inj 2 MG/2ML (Base Equivalent): INTRAMUSCULAR | Qty: 2 | Status: AC

## 2016-07-26 ENCOUNTER — Other Ambulatory Visit: Payer: Self-pay | Admitting: Family Medicine

## 2016-07-26 DIAGNOSIS — Z1231 Encounter for screening mammogram for malignant neoplasm of breast: Secondary | ICD-10-CM

## 2016-08-08 ENCOUNTER — Ambulatory Visit
Admission: RE | Admit: 2016-08-08 | Discharge: 2016-08-08 | Disposition: A | Discharge status: Home / Self Care | Payer: BLUE CROSS/BLUE SHIELD | Source: Ambulatory Visit | Attending: Family Medicine | Admitting: Family Medicine

## 2016-08-08 DIAGNOSIS — Z1231 Encounter for screening mammogram for malignant neoplasm of breast: Secondary | ICD-10-CM | POA: Insufficient documentation

## 2016-10-19 ENCOUNTER — Other Ambulatory Visit: Payer: Self-pay

## 2016-11-29 ENCOUNTER — Ambulatory Visit: Payer: BLUE CROSS/BLUE SHIELD | Admitting: Anesthesiology

## 2016-11-29 ENCOUNTER — Encounter
Admission: RE | Disposition: A | Discharge status: Home / Self Care | Payer: Self-pay | Source: Ambulatory Visit | Attending: Gastroenterology

## 2016-11-29 ENCOUNTER — Ambulatory Visit
Admission: RE | Admit: 2016-11-29 | Discharge: 2016-11-29 | Disposition: A | Discharge status: Home / Self Care | Payer: BLUE CROSS/BLUE SHIELD | Source: Ambulatory Visit | Attending: Gastroenterology | Admitting: Gastroenterology

## 2016-11-29 DIAGNOSIS — Z87891 Personal history of nicotine dependence: Secondary | ICD-10-CM | POA: Insufficient documentation

## 2016-11-29 DIAGNOSIS — Z91048 Other nonmedicinal substance allergy status: Secondary | ICD-10-CM | POA: Diagnosis not present

## 2016-11-29 DIAGNOSIS — Z791 Long term (current) use of non-steroidal anti-inflammatories (NSAID): Secondary | ICD-10-CM | POA: Insufficient documentation

## 2016-11-29 DIAGNOSIS — Z1211 Encounter for screening for malignant neoplasm of colon: Secondary | ICD-10-CM | POA: Diagnosis not present

## 2016-11-29 DIAGNOSIS — Z79891 Long term (current) use of opiate analgesic: Secondary | ICD-10-CM | POA: Diagnosis not present

## 2016-11-29 DIAGNOSIS — Z9889 Other specified postprocedural states: Secondary | ICD-10-CM | POA: Insufficient documentation

## 2016-11-29 DIAGNOSIS — E739 Lactose intolerance, unspecified: Secondary | ICD-10-CM | POA: Diagnosis not present

## 2016-11-29 DIAGNOSIS — K219 Gastro-esophageal reflux disease without esophagitis: Secondary | ICD-10-CM | POA: Diagnosis not present

## 2016-11-29 DIAGNOSIS — Z79899 Other long term (current) drug therapy: Secondary | ICD-10-CM | POA: Insufficient documentation

## 2016-11-29 DIAGNOSIS — D120 Benign neoplasm of cecum: Secondary | ICD-10-CM

## 2016-11-29 DIAGNOSIS — K621 Rectal polyp: Secondary | ICD-10-CM

## 2016-11-29 DIAGNOSIS — Z885 Allergy status to narcotic agent status: Secondary | ICD-10-CM | POA: Insufficient documentation

## 2016-11-29 DIAGNOSIS — F329 Major depressive disorder, single episode, unspecified: Secondary | ICD-10-CM | POA: Insufficient documentation

## 2016-11-29 DIAGNOSIS — K640 First degree hemorrhoids: Secondary | ICD-10-CM | POA: Insufficient documentation

## 2016-11-29 HISTORY — PX: COLONOSCOPY WITH PROPOFOL: SHX5780

## 2016-11-29 SURGERY — COLONOSCOPY WITH PROPOFOL
Anesthesia: General

## 2016-11-29 MED ORDER — PROPOFOL 10 MG/ML IV BOLUS
INTRAVENOUS | Status: DC | PRN
  Administered 2016-11-29: 70 mg via INTRAVENOUS
  Administered 2016-11-29: 30 mg via INTRAVENOUS
  Administered 2016-11-29: 50 mg via INTRAVENOUS
  Administered 2016-11-29: 30 mg via INTRAVENOUS

## 2016-11-29 MED ORDER — PROPOFOL 500 MG/50ML IV EMUL
INTRAVENOUS | Status: AC
  Filled 2016-11-29: qty 50

## 2016-11-29 MED ORDER — LIDOCAINE HCL (PF) 1 % IJ SOLN
2.0000 mL | Freq: Once | INTRAMUSCULAR | Status: AC
  Administered 2016-11-29: 0.3 mL via INTRADERMAL
  Filled 2016-11-29: qty 2

## 2016-11-29 MED ORDER — PROPOFOL 500 MG/50ML IV EMUL
INTRAVENOUS | Status: DC | PRN
  Administered 2016-11-29: 150 ug/kg/min via INTRAVENOUS

## 2016-11-29 MED ORDER — PROPOFOL 10 MG/ML IV BOLUS
INTRAVENOUS | Status: AC
  Filled 2016-11-29: qty 20

## 2016-11-29 MED ORDER — SODIUM CHLORIDE 0.9 % IV SOLN
INTRAVENOUS | Status: DC
  Administered 2016-11-29: 1000 mL via INTRAVENOUS

## 2016-11-29 NOTE — Transfer of Care (Signed)
Immediate Anesthesia Transfer of Care Note  Patient: Rebecca Cobb  Procedure(s) Performed: Procedure(s): COLONOSCOPY WITH PROPOFOL (N/A)  Patient Location: PACU  Anesthesia Type:General  Level of Consciousness: awake and patient cooperative  Airway & Oxygen Therapy: Patient Spontanous Breathing  Post-op Assessment: Report given to RN and Post -op Vital signs reviewed and stable  Post vital signs: Reviewed and stable  Last Vitals:  Vitals:   11/29/16 0827 11/29/16 0941  BP: 111/76 106/64  Pulse: 77 70  Resp: 17 18  Temp: 36.4 C     Last Pain:  Vitals:   11/29/16 0827  TempSrc: Tympanic  PainSc: 6          Complications: No apparent anesthesia complications

## 2016-11-29 NOTE — Anesthesia Postprocedure Evaluation (Signed)
Anesthesia Post Note  Patient: Rebecca Cobb  Procedure(s) Performed: Procedure(s) (LRB): COLONOSCOPY WITH PROPOFOL (N/A)  Patient location during evaluation: Endoscopy Anesthesia Type: General Level of consciousness: awake and alert Pain management: pain level controlled Vital Signs Assessment: post-procedure vital signs reviewed and stable Respiratory status: spontaneous breathing and respiratory function stable Cardiovascular status: stable Anesthetic complications: no     Last Vitals:  Vitals:   11/29/16 0941 11/29/16 0943  BP: 106/64   Pulse: 70 68  Resp: 18 19  Temp:      Last Pain:  Vitals:   11/29/16 0941  TempSrc: Tympanic  PainSc: 0-No pain                 KEPHART,WILLIAM K

## 2016-11-29 NOTE — Op Note (Signed)
[facility] Patient Name: Rebecca Cobb Procedure Date: 11/29/2016 9:12 AM MRN: [id number] Account #: [id number] Date of Birth: [birth year] Admit Type: Inpatient Age: 56 Room: [facility] ENDO ROOM 4 Gender: Female Note Status: Finalized Procedure:            Colonoscopy Indications:          Screening for colorectal malignant neoplasm Providers:            Lucilla Lame MD, MD Referring MD:         Kerin Perna, MD (Referring MD) Medicines:            Propofol per Anesthesia Complications:        No immediate complications. Procedure:            Pre-Anesthesia Assessment:                       - Prior to the procedure, a History and Physical was                        performed, and patient medications and allergies were                        reviewed. The patient's tolerance of previous                        anesthesia was also reviewed. The risks and benefits of                        the procedure and the sedation options and risks were                        discussed with the patient. All questions were                        answered, and informed consent was obtained. Prior                        Anticoagulants: The patient has taken no previous                        anticoagulant or antiplatelet agents. ASA Grade                        Assessment: II - A patient with mild systemic disease.                        After reviewing the risks and benefits, the patient was                        deemed in satisfactory condition to undergo the                        procedure.                       After obtaining informed consent, the colonoscope was                        passed under direct vision. Throughout the procedure,  the patient's blood pressure, pulse, and oxygen                        saturations were monitored continuously. The                        Colonoscope was introduced through the anus and         advanced to the the cecum, identified by appendiceal                        orifice and ileocecal valve. The colonoscopy was                        performed without difficulty. The patient tolerated the                        procedure well. The quality of the bowel preparation                        was excellent. Findings:      The perianal and digital rectal examinations were normal.      A 3 mm polyp was found in the rectum. The polyp was sessile. The polyp       was removed with a cold biopsy forceps. Resection and retrieval were       complete.      A 5 mm polyp was found in the cecum. The polyp was sessile. The polyp       was removed with a cold snare. Resection and retrieval were complete.      Non-bleeding internal hemorrhoids were found during retroflexion. The       hemorrhoids were Grade I (internal hemorrhoids that do not prolapse). Impression:           - One 3 mm polyp in the rectum, removed with a cold                        biopsy forceps. Resected and retrieved.                       - One 5 mm polyp in the cecum, removed with a cold                        snare. Resected and retrieved.                       - Non-bleeding internal hemorrhoids. Recommendation:       - Discharge patient to home.                       - Resume previous diet.                       - Continue present medications.                       - Await pathology results.                       - Repeat colonoscopy in 5 years if polyp adenoma and 10  years if hyperplastic Procedure Code(s):    --- Professional ---                       [phone], Colonoscopy, flexible; with removal of tumor(s),                        polyp(s), or other lesion(s) by snare technique                       45380, 59, Colonoscopy, flexible; with biopsy, single                        or multiple Diagnosis Code(s):    --- Professional ---                       Z12.11, Encounter for screening for  malignant neoplasm                        of colon                       D12.0, Benign neoplasm of cecum                       K62.1, Rectal polyp CPT copyright 2016 American Medical Association. All rights reserved. The codes documented in this report are preliminary and upon coder review may  be revised to meet current compliance requirements. Lucilla Lame MD, MD 11/29/2016 9:37:23 AM This report has been signed electronically. Number of Addenda: 0 Note Initiated On: 11/29/2016 9:12 AM Scope Withdrawal Time: 0 hours 12 minutes 49 seconds  Total Procedure Duration: 0 hours 17 minutes 12 seconds       [facility]

## 2016-11-29 NOTE — Anesthesia Preprocedure Evaluation (Signed)
Anesthesia Evaluation  Patient identified by MRN, date of birth, ID band Patient awake    Reviewed: Allergy & Precautions, NPO status , Patient's Chart, lab work & pertinent test results  History of Anesthesia Complications Negative for: history of anesthetic complications  Airway Mallampati: II       Dental   Pulmonary neg pulmonary ROS, former smoker,           Cardiovascular negative cardio ROS       Neuro/Psych Anxiety Depression negative neurological ROS     GI/Hepatic Neg liver ROS, GERD (remote hx)  ,  Endo/Other  negative endocrine ROS  Renal/GU negative Renal ROS     Musculoskeletal   Abdominal   Peds  Hematology negative hematology ROS (+)   Anesthesia Other Findings   Reproductive/Obstetrics                             Anesthesia Physical Anesthesia Plan  ASA: II  Anesthesia Plan: General   Post-op Pain Management:    Induction: Intravenous  Airway Management Planned: Nasal Cannula  Additional Equipment:   Intra-op Plan:   Post-operative Plan:   Informed Consent: I have reviewed the patients History and Physical, chart, labs and discussed the procedure including the risks, benefits and alternatives for the proposed anesthesia with the patient or authorized representative who has indicated his/her understanding and acceptance.     Plan Discussed with:   Anesthesia Plan Comments:         Anesthesia Quick Evaluation

## 2016-11-29 NOTE — H&P (Signed)
Rebecca Lame, MD [facility] [address] [place], [place] [address] Phone: [phone] Fax : [phone]  Primary Care Physician:  Hortencia Pilar, MD Primary Gastroenterologist:  Dr. Allen Norris  Pre-Procedure History & Physical: HPI:  Rebecca Cobb is a 56 y.o. female is here for a screening colonoscopy.   Past Medical History:  Diagnosis Date  . Claustrophobia    severe,Caused by laying flat or "things on face"  . Claustrophobia   . Depression    no meds for 6 years  . GERD (gastroesophageal reflux disease)   . Headache   . Motion sickness    car - passenger  . Multiple skin nodules    FATTY DEPOSITS    Past Surgical History:  Procedure Laterality Date  . BREAST BIOPSY Right    neg  . BREAST CYST EXCISION Left    BENIGN-marker in breast  . CESAREAN SECTION     X3  . DILATION AND CURETTAGE OF UTERUS    . RADIOLOGY WITH ANESTHESIA N/A 09/23/2014   Procedure: MRI;  Surgeon: Medication Radiologist, MD;  Location: [place];  Service: Radiology;  Laterality: N/A;  . RADIOLOGY WITH ANESTHESIA Left 01/08/2015   Procedure: RADIOLOGY WITH ANESTHESIA/LEFT SHOULDER MRI;  Surgeon: Medication Radiologist, MD;  Location: [place];  Service: Radiology;  Laterality: Left;  DR. Lahoma Rocker  . RADIOLOGY WITH ANESTHESIA Left 11/17/2015   Procedure: LEFT SHOULDER MRI;  Surgeon: Medication Radiologist, MD;  Location: [place];  Service: Radiology;  Laterality: Left;  . SHOULDER ARTHROSCOPY WITH OPEN ROTATOR CUFF REPAIR AND DISTAL CLAVICLE ACROMINECTOMY Left 03/06/2015   Procedure: SHOULDER ARTHROSCOPY WITH RELEASE OF LONG HEAD OF BICEPS TENDON AND DECOMPRESSION;  Surgeon: Leanor Kail, MD;  Location: [place];  Service: Orthopedics;  Laterality: Left;  . WISDOM TOOTH EXTRACTION      Prior to Admission medications   Medication Sig Start Date End Date Taking? Authorizing Provider  atorvastatin (LIPITOR) 20 MG tablet Take 20 mg by mouth daily. AM 07/13/14   Historical Provider, MD    chlorzoxazone (PARAFON) 500 MG tablet Take 750 mg by mouth 3 (three) times daily. 10/19/15   Historical Provider, MD  FIBER PO Take by mouth daily. AM    Historical Provider, MD  HYDROmorphone (DILAUDID) 2 MG tablet Take 4 mg by mouth every 6 (six) hours as needed. 10/19/15   Historical Provider, MD  meloxicam (MOBIC) 15 MG tablet Take 15 mg by mouth daily. 10/19/15   Historical Provider, MD  Multiple Vitamin (MULTIVITAMIN) tablet Take 1 tablet by mouth daily. AM    Historical Provider, MD    Allergies as of 10/19/2016 - Review Complete 11/17/2015  Allergen Reaction Noted  . Codeine Hives and Nausea And Vomiting 09/15/2014  . Lactose intolerance (gi)  03/02/2015  . Tape Other (See Comments) 03/02/2015    Family History  Problem Relation Age of Onset  . Breast cancer Neg Hx     Social History   Social History  . Marital status: Married    Spouse name: N/A  . Number of children: N/A  . Years of education: N/A   Occupational History  . Not on file.   Social History Main Topics  . Smoking status: Former Smoker    Quit date: 04/06/2006  . Smokeless tobacco: Never Used  . Alcohol use 0.6 oz/week    1 Cans of beer per week     Comment: OCC  . Drug use: No  . Sexual activity: Not on file   Other Topics Concern  .  Not on file   Social History Narrative  . No narrative on file    Review of Systems: See HPI, otherwise negative ROS  Physical Exam: BP 111/76   Pulse 77   Temp 97.5 F (36.4 C) (Tympanic)   Resp 17   Ht 5\' 4"  (1.626 m)   Wt 172 lb (78 kg)   SpO2 100%   BMI 29.52 kg/m  General:   Alert,  pleasant and cooperative in NAD Head:  Normocephalic and atraumatic. Neck:  Supple; no masses or thyromegaly. Lungs:  Clear throughout to auscultation.    Heart:  Regular rate and rhythm. Abdomen:  Soft, nontender and nondistended. Normal bowel sounds, without guarding, and without rebound.   Neurologic:  Alert and  oriented x4;  grossly normal  neurologically.  Impression/Plan: Rebecca Cobb is now here to undergo a screening colonoscopy.  Risks, benefits, and alternatives regarding colonoscopy have been reviewed with the patient.  Questions have been answered.  All parties agreeable.

## 2016-11-29 NOTE — Anesthesia Post-op Follow-up Note (Cosign Needed)
Anesthesia QCDR form completed.        

## 2016-11-30 ENCOUNTER — Encounter: Payer: Self-pay | Admitting: Gastroenterology

## 2016-11-30 LAB — SURGICAL PATHOLOGY

## 2016-12-05 ENCOUNTER — Encounter: Payer: Self-pay | Admitting: Gastroenterology

## 2017-02-21 DIAGNOSIS — D239 Other benign neoplasm of skin, unspecified: Secondary | ICD-10-CM | POA: Insufficient documentation

## 2017-02-21 DIAGNOSIS — Z86010 Personal history of colonic polyps: Secondary | ICD-10-CM | POA: Insufficient documentation

## 2017-11-13 ENCOUNTER — Encounter: Payer: Self-pay | Admitting: Emergency Medicine

## 2017-11-13 ENCOUNTER — Other Ambulatory Visit: Payer: Self-pay

## 2017-11-13 ENCOUNTER — Ambulatory Visit
Admission: EM | Admit: 2017-11-13 | Discharge: 2017-11-13 | Disposition: A | Discharge status: Home / Self Care | Payer: BLUE CROSS/BLUE SHIELD | Attending: Family Medicine | Admitting: Family Medicine

## 2017-11-13 DIAGNOSIS — M545 Low back pain, unspecified: Secondary | ICD-10-CM

## 2017-11-13 MED ORDER — DICLOFENAC SODIUM 75 MG PO TBEC: 75.0000 mg | DELAYED_RELEASE_TABLET | Freq: Two times a day (BID) | ORAL | 0 refills | Status: DC

## 2017-11-13 MED ORDER — CYCLOBENZAPRINE HCL 10 MG PO TABS: 10.0000 mg | ORAL_TABLET | Freq: Three times a day (TID) | ORAL | 0 refills | Status: DC | PRN

## 2017-11-13 NOTE — ED Triage Notes (Signed)
Patient c/o lower back pain that started yesterday.  Patient states that she did lift a box yesterday.

## 2017-11-13 NOTE — ED Provider Notes (Signed)
[facility]    CSN: [id number] Arrival date & time: 11/13/17  1136  History   Chief Complaint Chief Complaint  Patient presents with  . Back Pain   HPI  57 year old female presents with low back pain.  It started yesterday evening.  She states that she did lift a awkward box off the front porch and thinks that this may be the culprit.  Pain is located in the left low back predominantly.  7/10 in severity. Worse with range of motion, lying and sitting.  She has had no relief with Motrin.  No reports of radicular symptoms.  No urinary symptoms.  No relieving factors.  No other associated symptoms.  No other complaints.  Past Medical History:  Diagnosis Date  . Claustrophobia    severe,Caused by laying flat or "things on face"  . Claustrophobia   . Depression    no meds for 6 years  . GERD (gastroesophageal reflux disease)   . Headache   . Motion sickness    car - passenger  . Multiple skin nodules    FATTY DEPOSITS    Patient Active Problem List   Diagnosis Date Noted  . Special screening for malignant neoplasms, colon   . Rectal polyp   . Benign neoplasm of cecum     Past Surgical History:  Procedure Laterality Date  . BREAST BIOPSY Right    neg  . BREAST CYST EXCISION Left    BENIGN-marker in breast  . CESAREAN SECTION     X3  . COLONOSCOPY WITH PROPOFOL N/A 11/29/2016   Procedure: COLONOSCOPY WITH PROPOFOL;  Surgeon: [place]illa Lame, MD;  Location: [facility] ENDOSCOPY;  Service: Endoscopy;  Laterality: N/A;  . DILATION AND CURETTAGE OF UTERUS    . RADIOLOGY WITH ANESTHESIA N/A 09/23/2014   Procedure: MRI;  Surgeon: Medication Radiologist, MD;  Location: [place];  Service: Radiology;  Laterality: N/A;  . RADIOLOGY WITH ANESTHESIA Left 01/08/2015   Procedure: RADIOLOGY WITH ANESTHESIA/LEFT SHOULDER MRI;  Surgeon: Medication Radiologist, MD;  Location: [place];  Service: Radiology;  Laterality: Left;  DR. Lahoma Rocker  . RADIOLOGY WITH ANESTHESIA Left 11/17/2015   Procedure: LEFT SHOULDER MRI;  Surgeon: Medication Radiologist, MD;  Location: [place];  Service: Radiology;  Laterality: Left;  . SHOULDER ARTHROSCOPY WITH OPEN ROTATOR CUFF REPAIR AND DISTAL CLAVICLE ACROMINECTOMY Left 03/06/2015   Procedure: SHOULDER ARTHROSCOPY WITH RELEASE OF LONG HEAD OF BICEPS TENDON AND DECOMPRESSION;  Surgeon: Leanor Kail, MD;  Location: [place];  Service: Orthopedics;  Laterality: Left;  . WISDOM TOOTH EXTRACTION      OB History    No data available       Home Medications    Prior to Admission medications   Medication Sig Start Date End Date Taking? Authorizing Provider  atorvastatin (LIPITOR) 20 MG tablet Take 20 mg by mouth daily. AM 07/13/14  Yes [provider]  sertraline (ZOLOFT) 100 MG tablet Take 100 mg by mouth daily.   Yes [provider]  cyclobenzaprine (FLEXERIL) 10 MG tablet Take 1 tablet (10 mg total) by mouth 3 (three) times daily as needed. 11/13/17   Coral Spikes, DO  diclofenac (VOLTAREN) 75 MG EC tablet Take 1 tablet (75 mg total) by mouth 2 (two) times daily. 11/13/17   Thersa Salt G, DO  FIBER PO Take by mouth daily. AM    [provider]  Multiple Vitamin (MULTIVITAMIN) tablet Take 1 tablet by mouth daily. AM    [provider]  Family History Family History  Problem Relation Age of Onset  . Breast cancer Neg Hx     Social History Social History   Tobacco Use  . Smoking status: Former Smoker    Last attempt to quit: 04/06/2006    Years since quitting: 11.6  . Smokeless tobacco: Never Used  Substance Use Topics  . Alcohol use: Yes    Alcohol/week: 0.6 oz    Types: 1 Cans of beer per week    Comment: OCC  . Drug use: No     Allergies   Codeine; Lactose intolerance (gi); and Tape   Review of Systems Review of Systems  Genitourinary: Negative.   Musculoskeletal: Positive for back pain.   Physical Exam Triage Vital Signs ED Triage Vitals  Enc Vitals Group     BP  11/13/17 1247 126/70     Pulse Rate 11/13/17 1247 78     Resp 11/13/17 1247 16     Temp 11/13/17 1247 98.5 F (36.9 C)     Temp Source 11/13/17 1247 Oral     SpO2 11/13/17 1247 97 %     Weight 11/13/17 1245 175 lb (79.4 kg)     Height 11/13/17 1245 5\' 3"  (1.6 m)     Head Circumference --      Peak Flow --      Pain Score 11/13/17 1244 10     Pain Loc --      Pain Edu? --      Excl. in [place]? --    Updated Vital Signs BP 126/70 (BP Location: Left Arm)   Pulse 78   Temp 98.5 F (36.9 C) (Oral)   Resp 16   Ht 5\' 3"  (1.6 m)   Wt 175 lb (79.4 kg)   SpO2 97%   BMI 31.00 kg/m   Physical Exam  Constitutional: She is oriented to person, place, and time. She appears well-developed. No distress.  Cardiovascular: Normal rate and regular rhythm.  Pulmonary/Chest: Effort normal and breath sounds normal.  Neurological: She is alert and oriented to person, place, and time.  Skin: Skin is warm. No rash noted.  Psychiatric: She has a normal mood and affect. Her behavior is normal.  Nursing note and vitals reviewed.  UC Treatments / Results  Labs (all labs ordered are listed, but only abnormal results are displayed) Labs Reviewed  URINE CULTURE  URINALYSIS, COMPLETE (UACMP) WITH MICROSCOPIC    EKG  EKG Interpretation None       Radiology No results found.  Procedures Procedures (including critical care time)  Medications Ordered in UC Medications - No data to display   Initial Impression / Assessment and Plan / UC Course  I have reviewed the triage vital signs and the nursing notes.  Pertinent labs & imaging results that were available during my care of the patient were reviewed by me and considered in my medical decision making (see chart for details).     57 year old female presents with acute low back pain.  Treating with diclofenac and Flexeril.  Supportive care.  Final Clinical Impressions(s) / UC Diagnoses   Final diagnoses:  Acute left-sided low back pain  without sciatica    ED Discharge Orders        Ordered    diclofenac (VOLTAREN) 75 MG EC tablet  2 times daily     11/13/17 1336    cyclobenzaprine (FLEXERIL) 10 MG tablet  3 times daily PRN     11/13/17 1336  Controlled Substance Prescriptions [place] Controlled Substance Registry consulted? Not Applicable   Coral Spikes, DO 11/13/17 1347

## 2017-11-13 NOTE — Discharge Instructions (Signed)
Relative rest.  Ice and heat.  Medications as prescribed.   Take care  Dr. Lacinda Axon

## 2017-11-13 NOTE — ED Notes (Signed)
No UA or Urine culture was performed on this patient, order was entered in error

## 2017-11-15 ENCOUNTER — Emergency Department: Payer: BLUE CROSS/BLUE SHIELD

## 2017-11-15 ENCOUNTER — Emergency Department
Admission: EM | Admit: 2017-11-15 | Discharge: 2017-11-15 | Disposition: A | Discharge status: Home / Self Care | Payer: BLUE CROSS/BLUE SHIELD | Attending: Emergency Medicine | Admitting: Emergency Medicine

## 2017-11-15 ENCOUNTER — Encounter: Payer: Self-pay | Admitting: Emergency Medicine

## 2017-11-15 ENCOUNTER — Other Ambulatory Visit: Payer: Self-pay

## 2017-11-15 DIAGNOSIS — Y999 Unspecified external cause status: Secondary | ICD-10-CM | POA: Insufficient documentation

## 2017-11-15 DIAGNOSIS — Z79899 Other long term (current) drug therapy: Secondary | ICD-10-CM | POA: Diagnosis not present

## 2017-11-15 DIAGNOSIS — S39012A Strain of muscle, fascia and tendon of lower back, initial encounter: Secondary | ICD-10-CM

## 2017-11-15 DIAGNOSIS — Y929 Unspecified place or not applicable: Secondary | ICD-10-CM | POA: Insufficient documentation

## 2017-11-15 DIAGNOSIS — Z87891 Personal history of nicotine dependence: Secondary | ICD-10-CM | POA: Insufficient documentation

## 2017-11-15 DIAGNOSIS — S3992XA Unspecified injury of lower back, initial encounter: Secondary | ICD-10-CM | POA: Diagnosis present

## 2017-11-15 DIAGNOSIS — X500XXA Overexertion from strenuous movement or load, initial encounter: Secondary | ICD-10-CM | POA: Diagnosis not present

## 2017-11-15 DIAGNOSIS — Y9389 Activity, other specified: Secondary | ICD-10-CM | POA: Insufficient documentation

## 2017-11-15 MED ORDER — LIDOCAINE 5 % EX PTCH: 1.0000 | MEDICATED_PATCH | Freq: Two times a day (BID) | CUTANEOUS | 0 refills | Status: DC

## 2017-11-15 MED ORDER — ACETAMINOPHEN 500 MG PO TABS
1000.0000 mg | ORAL_TABLET | Freq: Once | ORAL | Status: AC
  Administered 2017-11-15: 1000 mg via ORAL
  Filled 2017-11-15: qty 2

## 2017-11-15 MED ORDER — KETOROLAC TROMETHAMINE 30 MG/ML IJ SOLN
15.0000 mg | Freq: Once | INTRAMUSCULAR | Status: AC
  Administered 2017-11-15: 15 mg via INTRAVENOUS
  Filled 2017-11-15: qty 1

## 2017-11-15 MED ORDER — IBUPROFEN 600 MG PO TABS: 600.0000 mg | ORAL_TABLET | Freq: Four times a day (QID) | ORAL | 0 refills | Status: DC | PRN

## 2017-11-15 MED ORDER — OXYCODONE HCL 5 MG PO TABS: 5.0000 mg | ORAL_TABLET | ORAL | 0 refills | Status: DC | PRN

## 2017-11-15 MED ORDER — OXYCODONE HCL 5 MG PO TABS
5.0000 mg | ORAL_TABLET | Freq: Once | ORAL | Status: AC
  Administered 2017-11-15: 5 mg via ORAL
  Filled 2017-11-15: qty 1

## 2017-11-15 MED ORDER — LIDOCAINE 5 % EX PTCH
1.0000 | MEDICATED_PATCH | CUTANEOUS | Status: DC
  Administered 2017-11-15: 1 via TRANSDERMAL
  Filled 2017-11-15: qty 1

## 2017-11-15 NOTE — ED Triage Notes (Addendum)
Patient moved a box on Saturday and saw doc Monday and diclofenac 75mg  and cyclobenzaprine 10mg  . But has been gradually getting worse and patient states she is not able to bear weight on left leg. Back pain is left side lower. Patient given 42mcg of fentanyl by EMS. Vitals stable for EMS. Patient states she is ok laying down but pain radiates into left hip. Patient had hx of a similar episode with her back almost 30 years ago.

## 2017-11-15 NOTE — Discharge Instructions (Signed)
You have been seen in the Emergency Department (ED)  today for back pain.  Back pain has many possible causes some are related to muscles while others have more serious causes. Even though you were checked carefully today and your exam and evaluation were reassuring, problems may develop later or continue to unfold. Therefore it is imperative that you follow up with doctor closely for further evaluation.  Follow-up with your doctor in 1 day for further evaluation.  For pain control take: 600 mg of ibuprofen every 6 hours, 1000mg  tylenol (2 extra strength) every 8 hours. If you have breakthrough pain, take 1 oxycodone every 4-6 hours. Apply one lidocaine patch every 12 hours.   When should you call for help?  Call your doctor now or seek immediate medical care if:  You have new or worsening numbness in your legs.  You have new or worsening weakness in your legs. (This could make it hard to stand up.)  You lose control of your bladder or bowels or if you are unable to urinate. You have numbness of your groin or buttock region If you develop a fever  Watch closely for changes in your health, and be sure to contact your doctor if:  Your pain gets worse.  You are not getting better after 2 weeks.  How can you care for yourself at home?  Take pain medicines exactly as directed.  If the doctor gave you a prescription medicine for pain, take it as prescribed.  If you are not taking a prescription pain medicine, ask your doctor if you can take an over-the-counter medicine like tylenol or ibuprofen. Sit or lie in positions that are most comfortable and reduce your pain. Try one of these positions when you lie down:  Lie on your back with your knees bent and supported by large pillows.  Lie on the floor with your legs on the seat of a sofa or chair.  Lie on your side with your knees and hips bent and a pillow between your legs.  Lie on your stomach if it does not make pain worse. Do not sit up in  bed, and avoid soft couches and twisted positions. Bed rest can help relieve pain at first, but it delays healing. Avoid bed rest after the first day of back pain.  Change positions every 30 minutes. If you must sit for long periods of time, take breaks from sitting. Get up and walk around, or lie in a comfortable position.  Try using a heating pad on a low or medium setting for 15 to 20 minutes every 2 or 3 hours. Try a warm shower in place of one session with the heating pad.  You can also try an ice pack for 10 to 15 minutes every 2 to 3 hours. Put a thin cloth between the ice pack and your skin.  Take short walks several times a day. You can start with 5 to 10 minutes, 3 or 4 times a day, and work up to longer walks. Walk on level surfaces and avoid hills and stairs until your back is better.  Return to work and other activities as soon as you can. Continued rest without activity is usually not good for your back.  To prevent future back pain, do exercises to stretch and strengthen your back and stomach. Learn how to use good posture, safe lifting techniques, and proper body mechanics.
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
## 2017-11-15 NOTE — ED Notes (Signed)
Patient placed on 2L [place] since 02 was falling to 90% on RA when patient was resting. After 2L patient is 98%

## 2017-11-20 ENCOUNTER — Other Ambulatory Visit: Payer: Self-pay | Admitting: Family Medicine

## 2017-11-20 DIAGNOSIS — Z1231 Encounter for screening mammogram for malignant neoplasm of breast: Secondary | ICD-10-CM

## 2017-12-13 ENCOUNTER — Ambulatory Visit: Payer: Self-pay

## 2017-12-21 ENCOUNTER — Ambulatory Visit
Admission: RE | Admit: 2017-12-21 | Discharge: 2017-12-21 | Disposition: A | Discharge status: Home / Self Care | Payer: BLUE CROSS/BLUE SHIELD | Source: Ambulatory Visit | Attending: Family Medicine | Admitting: Family Medicine

## 2017-12-21 DIAGNOSIS — Z1231 Encounter for screening mammogram for malignant neoplasm of breast: Secondary | ICD-10-CM

## 2017-12-21 HISTORY — DX: Malignant (primary) neoplasm, unspecified: C80.1

## 2018-08-27 ENCOUNTER — Emergency Department
Admission: EM | Admit: 2018-08-27 | Discharge: 2018-08-27 | Disposition: A | Discharge status: Other Acute Inpatient Hospital | Payer: BLUE CROSS/BLUE SHIELD | Attending: Emergency Medicine | Admitting: Emergency Medicine

## 2018-08-27 ENCOUNTER — Emergency Department: Payer: BLUE CROSS/BLUE SHIELD

## 2018-08-27 ENCOUNTER — Other Ambulatory Visit: Payer: Self-pay

## 2018-08-27 ENCOUNTER — Encounter: Payer: Self-pay | Admitting: Emergency Medicine

## 2018-08-27 DIAGNOSIS — R079 Chest pain, unspecified: Secondary | ICD-10-CM | POA: Diagnosis not present

## 2018-08-27 DIAGNOSIS — R1011 Right upper quadrant pain: Secondary | ICD-10-CM

## 2018-08-27 DIAGNOSIS — Z79899 Other long term (current) drug therapy: Secondary | ICD-10-CM | POA: Diagnosis not present

## 2018-08-27 DIAGNOSIS — K7689 Other specified diseases of liver: Secondary | ICD-10-CM

## 2018-08-27 DIAGNOSIS — Z87891 Personal history of nicotine dependence: Secondary | ICD-10-CM | POA: Diagnosis not present

## 2018-08-27 DIAGNOSIS — Z85828 Personal history of other malignant neoplasm of skin: Secondary | ICD-10-CM | POA: Insufficient documentation

## 2018-08-27 LAB — COMPREHENSIVE METABOLIC PANEL
ALK PHOS: 88 U/L (ref 38–126)
ALT: 34 U/L (ref 0–44)
AST: 23 U/L (ref 15–41)
Albumin: 4.2 g/dL (ref 3.5–5.0)
Anion gap: 9 (ref 5–15)
BUN: 15 mg/dL (ref 6–20)
CALCIUM: 10 mg/dL (ref 8.9–10.3)
CO2: 24 mmol/L (ref 22–32)
CREATININE: 0.65 mg/dL (ref 0.44–1.00)
Chloride: 104 mmol/L (ref 98–111)
GFR calc Af Amer: >60 mL/min (ref >60)
GFR calc non Af Amer: >60 mL/min (ref >60)
GLUCOSE: 111 mg/dL — AB (ref 70–99)
Potassium: 4 mmol/L (ref 3.5–5.1)
SODIUM: 137 mmol/L (ref 135–145)
Total Bilirubin: 0.5 mg/dL (ref 0.3–1.2)
Total Protein: 7.3 g/dL (ref 6.5–8.1)

## 2018-08-27 LAB — URINALYSIS, COMPLETE (UACMP) WITH MICROSCOPIC
Bilirubin Urine: NEGATIVE
Glucose, UA: NEGATIVE mg/dL
Hgb urine dipstick: NEGATIVE
Ketones, ur: NEGATIVE mg/dL
Nitrite: NEGATIVE
PROTEIN: NEGATIVE mg/dL
SPECIFIC GRAVITY, URINE: 1.017 (ref 1.005–1.030)
pH: 5 (ref 5.0–8.0)

## 2018-08-27 LAB — CBC
HEMATOCRIT: 41.1 % (ref 36.0–46.0)
Hemoglobin: 13.6 g/dL (ref 12.0–15.0)
MCH: 31.2 pg (ref 26.0–34.0)
MCHC: 33.1 g/dL (ref 30.0–36.0)
MCV: 94.3 fL (ref 80.0–100.0)
NRBC: 0 % (ref 0.0–0.2)
PLATELETS: 261 10*3/uL (ref 150–400)
RBC: 4.36 MIL/uL (ref 3.87–5.11)
RDW: 12.2 % (ref 11.5–15.5)
WBC: 5.7 10*3/uL (ref 4.0–10.5)

## 2018-08-27 LAB — LIPASE, BLOOD: Lipase: 32 U/L (ref 11–51)

## 2018-08-27 LAB — TROPONIN I: Troponin I: <0.03 ng/mL (ref <0.03)

## 2018-08-27 MED ORDER — ACETAMINOPHEN 10 MG/ML IV SOLN: 1000.0000 mg | Freq: Once | INTRAVENOUS | Status: DC

## 2018-08-27 MED ORDER — FENTANYL CITRATE (PF) 100 MCG/2ML IJ SOLN
50.0000 ug | Freq: Once | INTRAMUSCULAR | Status: AC
  Administered 2018-08-27: 50 ug via INTRAVENOUS
  Filled 2018-08-27: qty 2

## 2018-08-27 MED ORDER — SODIUM CHLORIDE 0.9 % IV BOLUS: 1000.0000 mL | Freq: Once | INTRAVENOUS | Status: DC

## 2018-08-27 MED ORDER — ACETAMINOPHEN 10 MG/ML IV SOLN: 1000.0000 mg | Freq: Four times a day (QID) | INTRAVENOUS | Status: DC

## 2018-08-27 MED ORDER — GADOBUTROL 1 MMOL/ML IV SOLN
7.5000 mL | Freq: Once | INTRAVENOUS | Status: AC | PRN
  Administered 2018-08-27: 7.5 mL via INTRAVENOUS

## 2018-08-27 MED ORDER — SODIUM CHLORIDE 0.9 % IV BOLUS
1000.0000 mL | Freq: Once | INTRAVENOUS | Status: AC
  Administered 2018-08-27: 1000 mL via INTRAVENOUS

## 2018-08-27 MED ORDER — ACETAMINOPHEN 10 MG/ML IV SOLN
1000.0000 mg | INTRAVENOUS | Status: AC
  Administered 2018-08-27: 1000 mg via INTRAVENOUS
  Filled 2018-08-27: qty 100

## 2018-08-27 MED ORDER — LORAZEPAM 2 MG/ML IJ SOLN
2.0000 mg | Freq: Once | INTRAMUSCULAR | Status: AC
  Administered 2018-08-27: 2 mg via INTRAVENOUS
  Filled 2018-08-27: qty 1

## 2018-08-27 MED ORDER — IOPAMIDOL (ISOVUE-370) INJECTION 76%
100.0000 mL | Freq: Once | INTRAVENOUS | Status: AC | PRN
  Administered 2018-08-27: 75 mL via INTRAVENOUS

## 2018-08-27 MED ORDER — ONDANSETRON HCL 4 MG/2ML IJ SOLN
4.0000 mg | Freq: Once | INTRAMUSCULAR | Status: AC
  Administered 2018-08-27: 4 mg via INTRAVENOUS
  Filled 2018-08-27: qty 2

## 2018-08-27 NOTE — ED Provider Notes (Signed)
[facility] has no beds I am waiting for [facility] to call back   Nena Polio, MD 08/27/18 2034

## 2018-08-27 NOTE — ED Notes (Signed)
Pharmacy called to send IV acetaminophen

## 2018-08-27 NOTE — ED Provider Notes (Signed)
[facility] calls back we will do an ER to ER transfer Dr. Chauncey Cruel HTN VI in the ER except.   Nena Polio, MD 08/27/18 [phone]

## 2018-08-27 NOTE — ED Notes (Signed)
Pharmacy called to obtain tylenol

## 2018-08-27 NOTE — ED Provider Notes (Signed)
MRI returns.  There looks like there is bleeding into the cyst.  Radiology also recommends surgical evaluation.  Family asked for [facility] doctor and also suggest [facility].  We will try that.   Nena Polio, MD 08/27/18 Despina Pole

## 2018-08-27 NOTE — ED Triage Notes (Signed)
Pt presents to ED via POV with c/o upper abdominal pain that radiates up into her chest x 2 weeks, states more severe over the last 2 weeks. Pt appears uncomfortable in triage. Also states hx of "severe anxiety". Pt states taking Tylenol with no relief. Pt states pain radiates to her back, pt also states pain worse with "any kind of touching".
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
## 2018-08-27 NOTE — ED Notes (Signed)
Patient transported to MRI 

## 2018-08-29 HISTORY — PX: OTHER SURGICAL HISTORY: SHX169

## 2019-06-28 ENCOUNTER — Other Ambulatory Visit: Payer: Self-pay | Admitting: Orthopedic Surgery

## 2019-06-28 DIAGNOSIS — G8929 Other chronic pain: Secondary | ICD-10-CM

## 2019-06-28 DIAGNOSIS — M25311 Other instability, right shoulder: Secondary | ICD-10-CM

## 2019-06-28 DIAGNOSIS — M25511 Pain in right shoulder: Secondary | ICD-10-CM

## 2019-07-02 ENCOUNTER — Other Ambulatory Visit (HOSPITAL_COMMUNITY): Payer: Self-pay | Admitting: Orthopedic Surgery

## 2019-07-02 DIAGNOSIS — M25311 Other instability, right shoulder: Secondary | ICD-10-CM

## 2019-07-02 DIAGNOSIS — G8929 Other chronic pain: Secondary | ICD-10-CM

## 2019-07-26 ENCOUNTER — Other Ambulatory Visit
Admission: RE | Admit: 2019-07-26 | Discharge: 2019-07-26 | Disposition: A | Discharge status: Home / Self Care | Payer: BC Managed Care – PPO | Source: Ambulatory Visit | Attending: Orthopedic Surgery | Admitting: Orthopedic Surgery

## 2019-07-26 ENCOUNTER — Other Ambulatory Visit: Payer: Self-pay

## 2019-07-26 DIAGNOSIS — Z01812 Encounter for preprocedural laboratory examination: Secondary | ICD-10-CM | POA: Insufficient documentation

## 2019-07-26 DIAGNOSIS — Z20828 Contact with and (suspected) exposure to other viral communicable diseases: Secondary | ICD-10-CM | POA: Diagnosis not present

## 2019-07-26 LAB — SARS CORONAVIRUS 2 (TAT 6-24 HRS): SARS Coronavirus 2: NEGATIVE

## 2019-07-27 NOTE — Progress Notes (Signed)
Sent high priority message through Epic to request H&P update in Care Everywhere to Rebecca Cobb, [place] at [facility]

## 2019-07-29 ENCOUNTER — Other Ambulatory Visit: Payer: Self-pay

## 2019-07-29 ENCOUNTER — Encounter (HOSPITAL_COMMUNITY): Payer: Self-pay | Admitting: Certified Registered Nurse Anesthetist

## 2019-07-29 ENCOUNTER — Encounter (HOSPITAL_COMMUNITY): Payer: Self-pay | Admitting: *Deleted

## 2019-07-29 NOTE — Progress Notes (Signed)
Patient denies shortness of breath, fever, cough and chest pain.  PCP - Dr Hortencia Pilar Cardiologist - denies  Chest x-ray - 08/27/18, 2 view EKG - 08/28/18 Stress Test - denies ECHO - denies Cardiac Cath - denies  Anesthesia review: No  STOP now taking any Aspirin (unless otherwise instructed by your surgeon), Aleve, Naproxen, Ibuprofen, Motrin, Advil, Goody's, BC's, all herbal medications, fish oil, and all vitamins.   Coronavirus Screening Covid test on 07/26/19 was negative.  Patient verbalized understanding of instructions that were given via phone.

## 2019-07-29 NOTE — Anesthesia Preprocedure Evaluation (Deleted)
Anesthesia Evaluation    Reviewed: Allergy & Precautions, Patient's Chart, lab work & pertinent test results  History of Anesthesia Complications (+) PONVNegative for: history of anesthetic complications  Airway Mallampati: II  TM Distance: >3 FB Neck ROM: Full    Dental no notable dental hx.    Pulmonary former smoker,  Quit smoking 2007, 5 pack year history   Pulmonary exam normal breath sounds clear to auscultation       Cardiovascular negative cardio ROS Normal cardiovascular exam Rhythm:Regular Rate:Normal     Neuro/Psych  Headaches, PSYCHIATRIC DISORDERS Anxiety Depression    GI/Hepatic GERD (remote hx)  ,(+)     substance abuse  alcohol use,   Endo/Other  negative endocrine ROS  Renal/GU negative Renal ROS  negative genitourinary   Musculoskeletal  (+) Arthritis , Osteoarthritis,  Chronic right shoulder pain- had MRI under [facility] in 2017 as well   Abdominal   Peds  Hematology negative hematology ROS (+)   Anesthesia Other Findings HLD  Severe claustrophobia  Reproductive/Obstetrics negative OB ROS                             Anesthesia Physical  Anesthesia Plan  ASA: II  Anesthesia Plan: General   Post-op Pain Management:    Induction: Intravenous  PONV Risk Score and Plan: 3 and Ondansetron, Dexamethasone, Midazolam and Treatment may vary due to age or medical condition  Airway Management Planned: LMA  Additional Equipment: None  Intra-op Plan:   Post-operative Plan: Extubation in OR  Informed Consent: I have reviewed the patients History and Physical, chart, labs and discussed the procedure including the risks, benefits and alternatives for the proposed anesthesia with the patient or authorized representative who has indicated his/her understanding and acceptance.     Dental advisory given  Plan Discussed with: CRNA  Anesthesia Plan Comments:          Anesthesia Quick Evaluation

## 2019-07-30 ENCOUNTER — Encounter (HOSPITAL_COMMUNITY): Payer: Self-pay

## 2019-07-30 ENCOUNTER — Ambulatory Visit (HOSPITAL_COMMUNITY): Admission: RE | Admit: 2019-07-30 | Payer: BC Managed Care – PPO | Source: Home / Self Care

## 2019-07-30 ENCOUNTER — Ambulatory Visit (HOSPITAL_COMMUNITY): Admission: RE | Admit: 2019-07-30 | Payer: BC Managed Care – PPO | Source: Ambulatory Visit

## 2019-07-30 HISTORY — DX: Hyperlipidemia, unspecified: E78.5

## 2019-07-30 HISTORY — DX: Other seasonal allergic rhinitis: J30.2

## 2019-07-30 SURGERY — MRI WITH ANESTHESIA
Anesthesia: General | Laterality: Right

## 2019-10-11 ENCOUNTER — Ambulatory Visit: Payer: BC Managed Care – PPO | Attending: Internal Medicine

## 2019-10-11 DIAGNOSIS — Z20822 Contact with and (suspected) exposure to covid-19: Secondary | ICD-10-CM

## 2019-10-12 LAB — NOVEL CORONAVIRUS, NAA: SARS-CoV-2, NAA: NOT DETECTED

## 2019-11-18 ENCOUNTER — Ambulatory Visit: Payer: BC Managed Care – PPO | Attending: Internal Medicine

## 2019-11-18 DIAGNOSIS — Z23 Encounter for immunization: Secondary | ICD-10-CM | POA: Insufficient documentation

## 2019-11-18 NOTE — Progress Notes (Signed)
   Covid-19 Vaccination Clinic  Name:  ATLEY CARDONE    MRN: [id number] DOB: [birth year]  11/18/2019  Ms. Maffett was observed post Covid-19 immunization for 15 minutes without incident. She was provided with Vaccine Information Sheet and instruction to access the V-Safe system.   Ms. Petrosian was instructed to call 911 with any severe reactions post vaccine: Marland Kitchen Difficulty breathing  . Swelling of face and throat  . A fast heartbeat  . A bad rash all over body  . Dizziness and weakness   Immunizations Administered    Name Date Dose VIS Date Route   Pfizer COVID-19 Vaccine 11/18/2019 10:04 AM 0.3 mL 08/23/2019 Intramuscular   Manufacturer: [place]   Lot: [id number]   [place]: [id number]

## 2019-12-10 ENCOUNTER — Ambulatory Visit: Payer: BC Managed Care – PPO | Attending: Internal Medicine

## 2019-12-10 DIAGNOSIS — Z23 Encounter for immunization: Secondary | ICD-10-CM

## 2019-12-10 NOTE — Progress Notes (Signed)
   Covid-19 Vaccination Clinic  Name:  Rebecca Cobb    MRN: [id number] DOB: [birth year]  12/10/2019  [name] was observed post Covid-19 immunization for 30 minutes based on pre-vaccination screening without incident. She was provided with Vaccine Information Sheet and instruction to access the V-Safe system.   [name] was instructed to call 911 with any severe reactions post vaccine: Marland Kitchen Difficulty breathing  . Swelling of face and throat  . A fast heartbeat  . A bad rash all over body  . Dizziness and weakness   Immunizations Administered    Name Date Dose VIS Date Route   Pfizer COVID-19 Vaccine 12/10/2019  3:10 PM 0.3 mL 08/23/2019 Intramuscular   Manufacturer: [place]   Lot: [id number]   [place]: [id number]

## 2020-01-07 ENCOUNTER — Telehealth (INDEPENDENT_AMBULATORY_CARE_PROVIDER_SITE_OTHER): Payer: Self-pay | Admitting: Gastroenterology

## 2020-01-07 ENCOUNTER — Other Ambulatory Visit (INDEPENDENT_AMBULATORY_CARE_PROVIDER_SITE_OTHER): Payer: Self-pay

## 2020-01-07 DIAGNOSIS — Z86010 Personal history of colonic polyps: Secondary | ICD-10-CM

## 2020-01-07 NOTE — Progress Notes (Signed)
Gastroenterology Pre-Procedure Review  Request Date: Friday 02/07/20 Requesting Physician: Dr. Allen Norris  PATIENT REVIEW QUESTIONS: The patient responded to the following health history questions as indicated:    1. Are you having any GI issues? no 2. Do you have a personal history of Polyps? yes (3 years ago) 3. Do you have a family history of Colon Cancer or Polyps? no 4. Diabetes Mellitus? no 5. Joint replacements in the past 12 months?no 6. Major health problems in the past 3 months?no 7. Any artificial heart valves, MVP, or defibrillator?no    MEDICATIONS & ALLERGIES:    Patient reports the following regarding taking any anticoagulation/antiplatelet therapy:   Plavix, Coumadin, Eliquis, Xarelto, Lovenox, Pradaxa, Brilinta, or Effient? no Aspirin? no  Patient confirms/reports the following medications:  Current Outpatient Medications  Medication Sig Dispense Refill  . acetaminophen (TYLENOL) 500 MG tablet Take 500-1,000 mg by mouth every 6 (six) hours as needed (pain).     Marland Kitchen atorvastatin (LIPITOR) 20 MG tablet Take 20 mg by mouth daily. AM  99  . levocetirizine (XYZAL) 5 MG tablet Take 5 mg by mouth every evening.    . Multiple Vitamin (MULTIVITAMIN) tablet Take 1 tablet by mouth daily. AM    . sertraline (ZOLOFT) 100 MG tablet Take 100 mg by mouth daily.     No current facility-administered medications for this visit.    Patient confirms/reports the following allergies:  Allergies  Allergen Reactions  . Codeine Hives and Nausea And Vomiting  . Lactose Other (See Comments)  . Lactose Intolerance (Gi)   . Other Other (See Comments)    Blisters.  Heavy tape after c-sect caused blisters  . Tape Other (See Comments)    Blisters.  Heavy tape after c-sect caused blisters  . Celecoxib Dermatitis and Other (See Comments)    No orders of the defined types were placed in this encounter.   AUTHORIZATION INFORMATION Primary Insurance: 1D#: Group #:  Secondary  Insurance: 1D#: Group #:  SCHEDULE INFORMATION: Date: Friday 02/07/20 Time: Location:[facility]

## 2020-01-29 ENCOUNTER — Encounter: Payer: Self-pay | Admitting: Gastroenterology

## 2020-01-29 ENCOUNTER — Other Ambulatory Visit: Payer: Self-pay | Admitting: Family Medicine

## 2020-01-29 ENCOUNTER — Other Ambulatory Visit: Payer: Self-pay

## 2020-01-29 DIAGNOSIS — Z1231 Encounter for screening mammogram for malignant neoplasm of breast: Secondary | ICD-10-CM

## 2020-02-04 NOTE — Anesthesia Preprocedure Evaluation (Addendum)
Anesthesia Evaluation  Patient identified by MRN, date of birth, ID band Patient awake    Reviewed: Allergy & Precautions, NPO status , Patient's Chart, lab work & pertinent test results, reviewed documented beta blocker date and time   History of Anesthesia Complications Negative for: history of anesthetic complications  Airway Mallampati: II  TM Distance: >3 FB Neck ROM: Full    Dental   Pulmonary former smoker,    breath sounds clear to auscultation       Cardiovascular (-) angina(-) DOE  Rhythm:Regular Rate:Normal   HLD   Neuro/Psych  Headaches, PSYCHIATRIC DISORDERS (Claustrophobia) Anxiety Depression    GI/Hepatic GERD  Controlled,  Endo/Other    Renal/GU      Musculoskeletal  (+) Arthritis ,   Abdominal (+) + obese (BMI 31),   Peds  Hematology   Anesthesia Other Findings Skin cancer  Reproductive/Obstetrics                            Anesthesia Physical Anesthesia Plan  ASA: II  Anesthesia Plan: General   Post-op Pain Management:    Induction: Intravenous  PONV Risk Score and Plan: 3 and Propofol infusion, TIVA and Treatment may vary due to age or medical condition  Airway Management Planned: Natural Airway and Nasal Cannula  Additional Equipment:   Intra-op Plan:   Post-operative Plan:   Informed Consent: I have reviewed the patients History and Physical, chart, labs and discussed the procedure including the risks, benefits and alternatives for the proposed anesthesia with the patient or authorized representative who has indicated his/her understanding and acceptance.       Plan Discussed with: CRNA and Anesthesiologist  Anesthesia Plan Comments:         Anesthesia Quick Evaluation

## 2020-02-05 ENCOUNTER — Other Ambulatory Visit
Admission: RE | Admit: 2020-02-05 | Discharge: 2020-02-05 | Disposition: A | Discharge status: Home / Self Care | Payer: BC Managed Care – PPO | Source: Ambulatory Visit | Attending: Gastroenterology | Admitting: Gastroenterology

## 2020-02-05 ENCOUNTER — Other Ambulatory Visit: Payer: Self-pay

## 2020-02-05 DIAGNOSIS — Z01812 Encounter for preprocedural laboratory examination: Secondary | ICD-10-CM | POA: Diagnosis present

## 2020-02-05 DIAGNOSIS — Z20822 Contact with and (suspected) exposure to covid-19: Secondary | ICD-10-CM | POA: Diagnosis not present

## 2020-02-05 LAB — SARS CORONAVIRUS 2 (TAT 6-24 HRS): SARS Coronavirus 2: NEGATIVE

## 2020-02-06 NOTE — Discharge Instructions (Signed)
General Anesthesia, Adult, Care After This sheet gives you information about how to care for yourself after your procedure. Your health care provider may also give you more specific instructions. If you have problems or questions, contact your health care provider. What can I expect after the procedure? After the procedure, the following side effects are common:  Pain or discomfort at the IV site.  Nausea.  Vomiting.  Sore throat.  Trouble concentrating.  Feeling cold or chills.  Weak or tired.  Sleepiness and fatigue.  Soreness and body aches. These side effects can affect parts of the body that were not involved in surgery. Follow these instructions at home:  For at least 24 hours after the procedure:  Have a responsible adult stay with you. It is important to have someone help care for you until you are awake and alert.  Rest as needed.  Do not: ? Participate in activities in which you could fall or become injured. ? Drive. ? Use heavy machinery. ? Drink alcohol. ? Take sleeping pills or medicines that cause drowsiness. ? Make important decisions or sign legal documents. ? Take care of children on your own. Eating and drinking  Follow any instructions from your health care provider about eating or drinking restrictions.  When you feel hungry, start by eating small amounts of foods that are soft and easy to digest (bland), such as toast. Gradually return to your regular diet.  Drink enough fluid to keep your urine pale yellow.  If you vomit, rehydrate by drinking water, juice, or clear broth. General instructions  If you have sleep apnea, surgery and certain medicines can increase your risk for breathing problems. Follow instructions from your health care provider about wearing your sleep device: ? Anytime you are sleeping, including during daytime naps. ? While taking prescription pain medicines, sleeping medicines, or medicines that make you drowsy.  Return to  your normal activities as told by your health care provider. Ask your health care provider what activities are safe for you.  Take over-the-counter and prescription medicines only as told by your health care provider.  If you smoke, do not smoke without supervision.  Keep all follow-up visits as told by your health care provider. This is important. Contact a health care provider if:  You have nausea or vomiting that does not get better with medicine.  You cannot eat or drink without vomiting.  You have pain that does not get better with medicine.  You are unable to pass urine.  You develop a skin rash.  You have a fever.  You have redness around your IV site that gets worse. Get help right away if:  You have difficulty breathing.  You have chest pain.  You have blood in your urine or stool, or you vomit blood. Summary  After the procedure, it is common to have a sore throat or nausea. It is also common to feel tired.  Have a responsible adult stay with you for the first 24 hours after general anesthesia. It is important to have someone help care for you until you are awake and alert.  When you feel hungry, start by eating small amounts of foods that are soft and easy to digest (bland), such as toast. Gradually return to your regular diet.  Drink enough fluid to keep your urine pale yellow.  Return to your normal activities as told by your health care provider. Ask your health care provider what activities are safe for you. This information is not   intended to replace advice given to you by your health care provider. Make sure you discuss any questions you have with your health care provider. Document Revised: 09/01/2017 Document Reviewed: 04/14/2017 Elsevier Patient Education  2020 Elsevier Inc.  

## 2020-02-07 ENCOUNTER — Encounter: Payer: Self-pay | Admitting: Gastroenterology

## 2020-02-07 ENCOUNTER — Encounter
Admission: RE | Disposition: A | Discharge status: Home / Self Care | Payer: Self-pay | Source: Home / Self Care | Attending: Gastroenterology

## 2020-02-07 ENCOUNTER — Other Ambulatory Visit: Payer: Self-pay

## 2020-02-07 ENCOUNTER — Ambulatory Visit: Payer: BC Managed Care – PPO | Admitting: Anesthesiology

## 2020-02-07 ENCOUNTER — Ambulatory Visit
Admission: RE | Admit: 2020-02-07 | Discharge: 2020-02-07 | Disposition: A | Discharge status: Home / Self Care | Payer: BC Managed Care – PPO | Attending: Gastroenterology | Admitting: Gastroenterology

## 2020-02-07 DIAGNOSIS — Z889 Allergy status to unspecified drugs, medicaments and biological substances status: Secondary | ICD-10-CM | POA: Diagnosis not present

## 2020-02-07 DIAGNOSIS — D124 Benign neoplasm of descending colon: Secondary | ICD-10-CM | POA: Diagnosis not present

## 2020-02-07 DIAGNOSIS — R519 Headache, unspecified: Secondary | ICD-10-CM | POA: Insufficient documentation

## 2020-02-07 DIAGNOSIS — F40240 Claustrophobia: Secondary | ICD-10-CM | POA: Diagnosis not present

## 2020-02-07 DIAGNOSIS — E739 Lactose intolerance, unspecified: Secondary | ICD-10-CM | POA: Insufficient documentation

## 2020-02-07 DIAGNOSIS — Z79899 Other long term (current) drug therapy: Secondary | ICD-10-CM | POA: Insufficient documentation

## 2020-02-07 DIAGNOSIS — Z1211 Encounter for screening for malignant neoplasm of colon: Secondary | ICD-10-CM | POA: Insufficient documentation

## 2020-02-07 DIAGNOSIS — Z86010 Personal history of colonic polyps: Secondary | ICD-10-CM | POA: Insufficient documentation

## 2020-02-07 DIAGNOSIS — K635 Polyp of colon: Secondary | ICD-10-CM

## 2020-02-07 DIAGNOSIS — K219 Gastro-esophageal reflux disease without esophagitis: Secondary | ICD-10-CM | POA: Insufficient documentation

## 2020-02-07 DIAGNOSIS — K640 First degree hemorrhoids: Secondary | ICD-10-CM | POA: Insufficient documentation

## 2020-02-07 DIAGNOSIS — D122 Benign neoplasm of ascending colon: Secondary | ICD-10-CM | POA: Insufficient documentation

## 2020-02-07 DIAGNOSIS — F329 Major depressive disorder, single episode, unspecified: Secondary | ICD-10-CM | POA: Insufficient documentation

## 2020-02-07 DIAGNOSIS — Z87891 Personal history of nicotine dependence: Secondary | ICD-10-CM | POA: Insufficient documentation

## 2020-02-07 DIAGNOSIS — Z791 Long term (current) use of non-steroidal anti-inflammatories (NSAID): Secondary | ICD-10-CM | POA: Diagnosis not present

## 2020-02-07 DIAGNOSIS — E669 Obesity, unspecified: Secondary | ICD-10-CM | POA: Insufficient documentation

## 2020-02-07 DIAGNOSIS — F419 Anxiety disorder, unspecified: Secondary | ICD-10-CM | POA: Diagnosis not present

## 2020-02-07 DIAGNOSIS — E785 Hyperlipidemia, unspecified: Secondary | ICD-10-CM | POA: Insufficient documentation

## 2020-02-07 DIAGNOSIS — Z6830 Body mass index (BMI) 30.0-30.9, adult: Secondary | ICD-10-CM | POA: Diagnosis not present

## 2020-02-07 DIAGNOSIS — M1990 Unspecified osteoarthritis, unspecified site: Secondary | ICD-10-CM | POA: Insufficient documentation

## 2020-02-07 DIAGNOSIS — Z888 Allergy status to other drugs, medicaments and biological substances status: Secondary | ICD-10-CM | POA: Insufficient documentation

## 2020-02-07 DIAGNOSIS — Z885 Allergy status to narcotic agent status: Secondary | ICD-10-CM | POA: Insufficient documentation

## 2020-02-07 HISTORY — PX: COLONOSCOPY WITH PROPOFOL: SHX5780

## 2020-02-07 HISTORY — DX: Unspecified osteoarthritis, unspecified site: M19.90

## 2020-02-07 SURGERY — COLONOSCOPY WITH PROPOFOL
Anesthesia: General | Site: Rectum

## 2020-02-07 MED ORDER — LIDOCAINE HCL (CARDIAC) PF 100 MG/5ML IV SOSY
PREFILLED_SYRINGE | INTRAVENOUS | Status: DC | PRN
  Administered 2020-02-07: 40 mg via INTRAVENOUS

## 2020-02-07 MED ORDER — LACTATED RINGERS IV SOLN
100.0000 mL/h | INTRAVENOUS | Status: DC
  Administered 2020-02-07: 100 mL/h via INTRAVENOUS

## 2020-02-07 MED ORDER — STERILE WATER FOR IRRIGATION IR SOLN
Status: DC | PRN
  Administered 2020-02-07: 1000 mL

## 2020-02-07 MED ORDER — ONDANSETRON HCL 4 MG/2ML IJ SOLN: 4.0000 mg | Freq: Once | INTRAMUSCULAR | Status: DC | PRN

## 2020-02-07 MED ORDER — ACETAMINOPHEN 10 MG/ML IV SOLN: 1000.0000 mg | Freq: Once | INTRAVENOUS | Status: DC | PRN

## 2020-02-07 MED ORDER — PROPOFOL 10 MG/ML IV BOLUS
INTRAVENOUS | Status: DC | PRN
  Administered 2020-02-07 (×3): 50 mg via INTRAVENOUS

## 2020-02-07 SURGICAL SUPPLY — 24 items
CLIP HMST 235XBRD CATH ROT (MISCELLANEOUS) IMPLANT
CLIP RESOLUTION 360 11X235 (MISCELLANEOUS)
ELECT REM PT RETURN 9FT ADLT (ELECTROSURGICAL)
ELECTRODE REM PT RTRN 9FT ADLT (ELECTROSURGICAL) IMPLANT
FCP ESCP3.2XJMB 240X2.8X (MISCELLANEOUS)
FORCEPS BIOP RAD 4 LRG CAP 4 (CUTTING FORCEPS) ×2 IMPLANT
FORCEPS BIOP RJ4 240 W/NDL (MISCELLANEOUS)
FORCEPS ESCP3.2XJMB 240X2.8X (MISCELLANEOUS) IMPLANT
GOWN CVR UNV OPN BCK APRN NK (MISCELLANEOUS) ×2 IMPLANT
GOWN ISOL THUMB LOOP REG UNIV (MISCELLANEOUS) ×2
INJECTOR VARIJECT VIN23 (MISCELLANEOUS) IMPLANT
KIT DEFENDO VALVE AND CONN (KITS) IMPLANT
KIT ENDO PROCEDURE OLY (KITS) ×2 IMPLANT
MANIFOLD NEPTUNE II (INSTRUMENTS) IMPLANT
MARKER SPOT ENDO TATTOO 5ML (MISCELLANEOUS) IMPLANT
PROBE APC STR FIRE (PROBE) IMPLANT
RETRIEVER NET ROTH 2.5X230 LF (MISCELLANEOUS) IMPLANT
SNARE SHORT THROW 13M SML OVAL (MISCELLANEOUS) IMPLANT
SNARE SHORT THROW 30M LRG OVAL (MISCELLANEOUS) IMPLANT
SNARE SNG USE RND 15MM (INSTRUMENTS) IMPLANT
SPOT EX ENDOSCOPIC TATTOO (MISCELLANEOUS)
TRAP ETRAP POLY (MISCELLANEOUS) IMPLANT
VARIJECT INJECTOR VIN23 (MISCELLANEOUS)
WATER STERILE IRR 250ML POUR (IV SOLUTION) ×2 IMPLANT

## 2020-02-07 NOTE — Transfer of Care (Signed)
Immediate Anesthesia Transfer of Care Note  Patient: Rebecca Cobb  Procedure(s) Performed: COLONOSCOPY WITH PROPOFOL, POLYPECTOMY (N/A Rectum)  Patient Location: PACU  Anesthesia Type: General  Level of Consciousness: awake, alert  and patient cooperative  Airway and Oxygen Therapy: Patient Spontanous Breathing and Patient connected to supplemental oxygen  Post-op Assessment: Post-op Vital signs reviewed, Patient's Cardiovascular Status Stable, Respiratory Function Stable, Patent Airway and No signs of Nausea or vomiting  Post-op Vital Signs: Reviewed and stable  Complications: No apparent anesthesia complications

## 2020-02-07 NOTE — H&P (Signed)
Lucilla Lame, MD [address] [place], [place] [address] [phone] Fax : [phone]  Primary Care Physician:  Hortencia Pilar, MD Primary Gastroenterologist:  Dr. Allen Norris  Pre-Procedure History & Physical: HPI:  Rebecca Cobb is a 59 y.o. female is here for an colonoscopy.   Past Medical History:  Diagnosis Date  . Arthritis    fingers  . Cancer ([place])    skin ca - face, upper chest  . Claustrophobia    severe,Caused by laying flat or "things on face", panic attacks  . Claustrophobia   . Depression    no meds for 6 years  . GERD (gastroesophageal reflux disease)    no meds  . Headache    occasional now  . Hyperlipidemia   . Motion sickness    car - passenger  . Motion sickness    in vehicle  . Multiple skin nodules    FATTY DEPOSITS  . Seasonal allergies     Past Surgical History:  Procedure Laterality Date  . BREAST BIOPSY Right    neg  . BREAST CYST EXCISION Left    BENIGN-marker in breast  . CESAREAN SECTION     X3  . COLONOSCOPY WITH PROPOFOL N/A 11/29/2016   Procedure: COLONOSCOPY WITH PROPOFOL;  Surgeon: Lucilla Lame, MD;  Location: [facility] END[facility];  Service: Endoscopy;  Laterality: N/A;  . DILATION AND CURETTAGE OF UTERUS    . liver cysts  08/29/2018   benigh, removed by surgery laparoscopy at [facility]  . RADIOLOGY WITH ANESTHESIA N/A 09/23/2014   Procedure: MRI;  Surgeon: Medication Radiologist, MD;  Location: [place];  Service: Radiology;  Laterality: N/A;  . RADIOLOGY WITH ANESTHESIA Left 01/08/2015   Procedure: RADIOLOGY WITH ANESTHESIA/LEFT SHOULDER MRI;  Surgeon: Medication Radiologist, MD;  Location: [place];  Service: Radiology;  Laterality: Left;  DR. Lahoma Rocker  . RADIOLOGY WITH ANESTHESIA Left 11/17/2015   Procedure: LEFT SHOULDER MRI;  Surgeon: Medication Radiologist, MD;  Location: [place];  Service: Radiology;  Laterality: Left;  . SHOULDER ARTHROSCOPY WITH OPEN ROTATOR CUFF REPAIR AND DISTAL CLAVICLE ACROMINECTOMY Left 03/06/2015   Procedure: SHOULDER ARTHROSCOPY WITH RELEASE OF LONG HEAD OF BICEPS TENDON AND DECOMPRESSION;  Surgeon: Leanor Kail, MD;  Location: [place];  Service: Orthopedics;  Laterality: Left;  . WISDOM TOOTH EXTRACTION      Prior to Admission medications   Medication Sig Start Date End Date Taking? Authorizing Provider  acetaminophen (TYLENOL) 500 MG tablet Take 500-1,000 mg by mouth every 6 (six) hours as needed (pain).    Yes [provider]  atorvastatin (LIPITOR) 20 MG tablet Take 20 mg by mouth daily. AM 07/13/14  Yes [provider]  sertraline (ZOLOFT) 100 MG tablet Take 150 mg by mouth daily.    Yes [provider]  levocetirizine (XYZAL) 5 MG tablet Take 5 mg by mouth every evening.    [provider]  Multiple Vitamin (MULTIVITAMIN) tablet Take 1 tablet by mouth daily. AM    [provider]    Allergies as of 01/07/2020 - Review Complete 01/07/2020  Allergen Reaction Noted  . Codeine Hives and Nausea And Vomiting 09/15/2014  . Lactose Other (See Comments) 03/02/2015  . Lactose intolerance (gi)  03/02/2015  . Other Other (See Comments) 04/23/2015  . Tape Other (See Comments) 03/02/2015  . Celecoxib Dermatitis and Other (See Comments) 05/04/2015    Family History  Problem Relation Age of Onset  . Breast cancer Neg Hx     Social History  Socioeconomic History  . Marital status: Married    Spouse name: Not on file  . Number of children: Not on file  . Years of education: Not on file  . Highest education level: Not on file  Occupational History  . Not on file  Tobacco Use  . Smoking status: Former Smoker    Packs/day: 0.25    Years: 20.00    Pack years: 5.00    Types: Cigarettes    Quit date: 04/06/2006    Years since quitting: 13.8  . Smokeless tobacco: Never Used  Substance and Sexual Activity  . Alcohol use: Yes    Alcohol/week: 5.0 - 6.0 standard drinks    Types: 5 - 6 Cans of beer per week  . Drug use: No    . Sexual activity: Not on file  Other Topics Concern  . Not on file  Social History Narrative  . Not on file   Social Determinants of Health   Financial Resource Strain:   . Difficulty of Paying Living Expenses:   Food Insecurity:   . Worried About Charity fundraiser in the Last Year:   . Arboriculturist in the Last Year:   Transportation Needs:   . Film/video editor (Medical):   Marland Kitchen Lack of Transportation (Non-Medical):   Physical Activity:   . Days of Exercise per Week:   . Minutes of Exercise per Session:   Stress:   . Feeling of Stress :   Social Connections:   . Frequency of Communication with Friends and Family:   . Frequency of Social Gatherings with Friends and Family:   . Attends Religious Services:   . Active Member of Clubs or Organizations:   . Attends Archivist Meetings:   Marland Kitchen Marital Status:   Intimate Partner Violence:   . Fear of Current or Ex-Partner:   . Emotionally Abused:   Marland Kitchen Physically Abused:   . Sexually Abused:     Review of Systems: See HPI, otherwise negative ROS  Physical Exam: BP 135/79   Pulse 73   Temp 97.7 F (36.5 C) (Temporal)   Resp 16   Ht 5\' 3"  (1.6 m)   Wt 78 kg   LMP  (LMP Unknown)   SpO2 97%   BMI 30.47 kg/m  General:   Alert,  pleasant and cooperative in NAD Head:  Normocephalic and atraumatic. Neck:  Supple; no masses or thyromegaly. Lungs:  Clear throughout to auscultation.    Heart:  Regular rate and rhythm. Abdomen:  Soft, nontender and nondistended. Normal bowel sounds, without guarding, and without rebound.   Neurologic:  Alert and  oriented x4;  grossly normal neurologically.  Impression/Plan: Rebecca Cobb is here for an colonoscopy to be performed for history of adenomatous polyps in 2018  Risks, benefits, limitations, and alternatives regarding  colonoscopy have been reviewed with the patient.  Questions have been answered.  All parties agreeable.   Lucilla Lame, MD  02/07/2020, 7:26 AM

## 2020-02-07 NOTE — Anesthesia Procedure Notes (Signed)
Date/Time: 02/07/2020 8:00 AM Performed by: Silvana Newness, CRNA Pre-anesthesia Checklist: Patient identified, Emergency Drugs available, Suction available, Patient being monitored and Timeout performed Patient Re-evaluated:Patient Re-evaluated prior to induction Oxygen Delivery Method: Nasal cannula Induction Type: IV induction

## 2020-02-07 NOTE — Op Note (Signed)
[facility] Gastroenterology Patient Name: Rebecca Cobb Procedure Date: 02/07/2020 7:52 AM MRN: [id number] Account #: [id number] Date of Birth: 01-Apr-1961 Admit Type: Outpatient Age: 59 Room: [facility] OR ROOM 01 Gender: Female Note Status: Finalized Procedure:             Colonoscopy Indications:           High risk colon cancer surveillance: Personal history                         of colonic polyps Providers:             Lucilla Lame MD, MD Referring MD:          Kerin Perna MD, MD (Referring MD) Medicines:             Propofol per Anesthesia Complications:         No immediate complications. Procedure:             Pre-Anesthesia Assessment:                        - Prior to the procedure, a History and Physical was                         performed, and patient medications and allergies were                         reviewed. The patient's tolerance of previous                         anesthesia was also reviewed. The risks and benefits                         of the procedure and the sedation options and risks                         were discussed with the patient. All questions were                         answered, and informed consent was obtained. Prior                         Anticoagulants: The patient has taken no previous                         anticoagulant or antiplatelet agents. ASA Grade                         Assessment: II - A patient with mild systemic disease.                         After reviewing the risks and benefits, the patient                         was deemed in satisfactory condition to undergo the                         procedure.  After obtaining informed consent, the colonoscope was                         passed under direct vision. Throughout the procedure,                         the patient's blood pressure, pulse, and oxygen                         saturations were monitored continuously. The was                      introduced through the anus and advanced to the the                         cecum, identified by appendiceal orifice and ileocecal                         valve. The colonoscopy was performed without                         difficulty. The patient tolerated the procedure well.                         The quality of the bowel preparation was excellent. Findings:      The perianal and digital rectal examinations were normal.      A 3 mm polyp was found in the ascending colon. The polyp was sessile.       The polyp was removed with a cold biopsy forceps. Resection and       retrieval were complete.      Two sessile polyps were found in the descending colon. The polyps were 2       to 4 mm in size. These polyps were removed with a cold biopsy forceps.       Resection and retrieval were complete.      A 4 mm polyp was found in the sigmoid colon. The polyp was sessile. The       polyp was removed with a cold biopsy forceps. Resection and retrieval       were complete.      Non-bleeding internal hemorrhoids were found during retroflexion. The       hemorrhoids were Grade I (internal hemorrhoids that do not prolapse). Impression:            - One 3 mm polyp in the ascending colon, removed with                         a cold biopsy forceps. Resected and retrieved.                        - Two 2 to 4 mm polyps in the descending colon,                         removed with a cold biopsy forceps. Resected and                         retrieved.                        -  One 4 mm polyp in the sigmoid colon, removed with a                         cold biopsy forceps. Resected and retrieved.                        - Non-bleeding internal hemorrhoids. Recommendation:        - Discharge patient to home.                        - Resume previous diet.                        - Continue present medications.                        - Await pathology results.                        - Repeat  colonoscopy in 5 years for surveillance. Procedure Code(s):     --- Professional ---                        [phone], Colonoscopy, flexible; with biopsy, single or                         multiple Diagnosis Code(s):     --- Professional ---                        Z86.010, Personal history of colonic polyps                        K63.5, Polyp of colon CPT copyright 2019 American Medical Association. All rights reserved. The codes documented in this report are preliminary and upon coder review may  be revised to meet current compliance requirements. Lucilla Lame MD, MD 02/07/2020 8:20:54 AM This report has been signed electronically. Number of Addenda: 0 Note Initiated On: 02/07/2020 7:52 AM Scope Withdrawal Time: 0 hours 10 minutes 6 seconds  Total Procedure Duration: 0 hours 13 minutes 9 seconds  Estimated Blood Loss:  Estimated blood loss: none.      [facility]

## 2020-02-07 NOTE — Anesthesia Postprocedure Evaluation (Signed)
Anesthesia Post Note  Patient: Rebecca Cobb  Procedure(s) Performed: COLONOSCOPY WITH PROPOFOL, POLYPECTOMY (N/A Rectum)     Patient location during evaluation: PACU Anesthesia Type: General Level of consciousness: awake and alert Pain management: pain level controlled Vital Signs Assessment: post-procedure vital signs reviewed and stable Respiratory status: spontaneous breathing, nonlabored ventilation, respiratory function stable and patient connected to nasal cannula oxygen Cardiovascular status: blood pressure returned to baseline and stable Postop Assessment: no apparent nausea or vomiting Anesthetic complications: no    Tysheka Fanguy A  Lakrista Scaduto

## 2020-02-11 ENCOUNTER — Encounter: Payer: Self-pay | Admitting: Gastroenterology

## 2020-02-11 ENCOUNTER — Ambulatory Visit: Payer: BC Managed Care – PPO

## 2020-02-11 LAB — SURGICAL PATHOLOGY

## 2020-03-11 ENCOUNTER — Other Ambulatory Visit: Payer: Self-pay

## 2020-03-11 ENCOUNTER — Ambulatory Visit
Admission: RE | Admit: 2020-03-11 | Discharge: 2020-03-11 | Disposition: A | Discharge status: Home / Self Care | Payer: BC Managed Care – PPO | Source: Ambulatory Visit | Attending: Family Medicine | Admitting: Family Medicine

## 2020-03-11 DIAGNOSIS — Z1231 Encounter for screening mammogram for malignant neoplasm of breast: Secondary | ICD-10-CM | POA: Insufficient documentation

## 2020-09-05 IMAGING — MG DIGITAL SCREENING BILAT W/ TOMO W/ CAD
8 series · 8 of 24 positions shown · non-contrast
Comparison: Previous exam(s).

CLINICAL DATA: Screening.

EXAM:
DIGITAL SCREENING BILATERAL MAMMOGRAM WITH TOMO AND CAD

[L CC synth-2D]
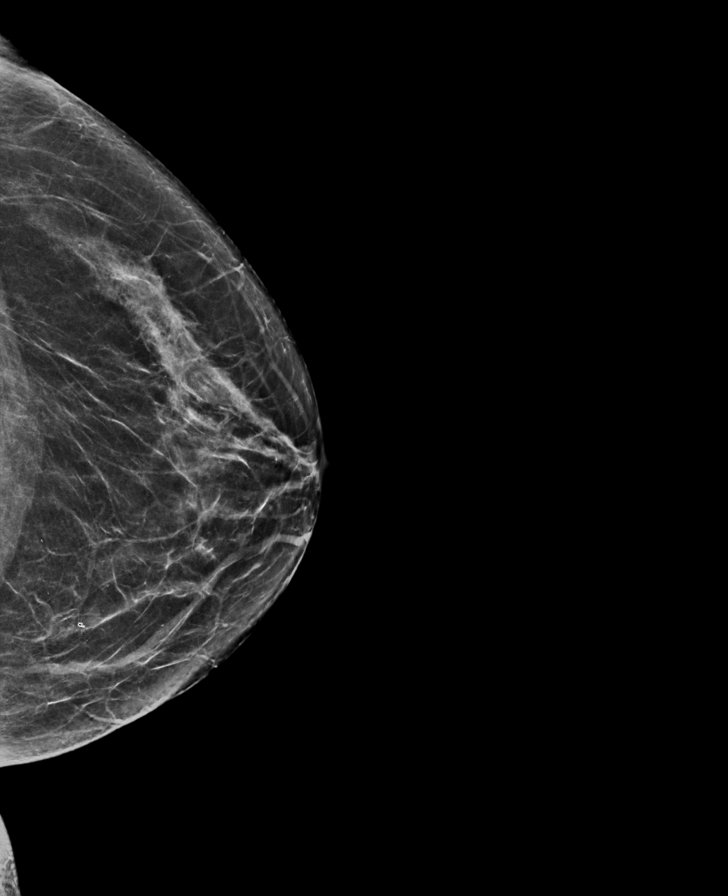

[L MLO synth-2D]
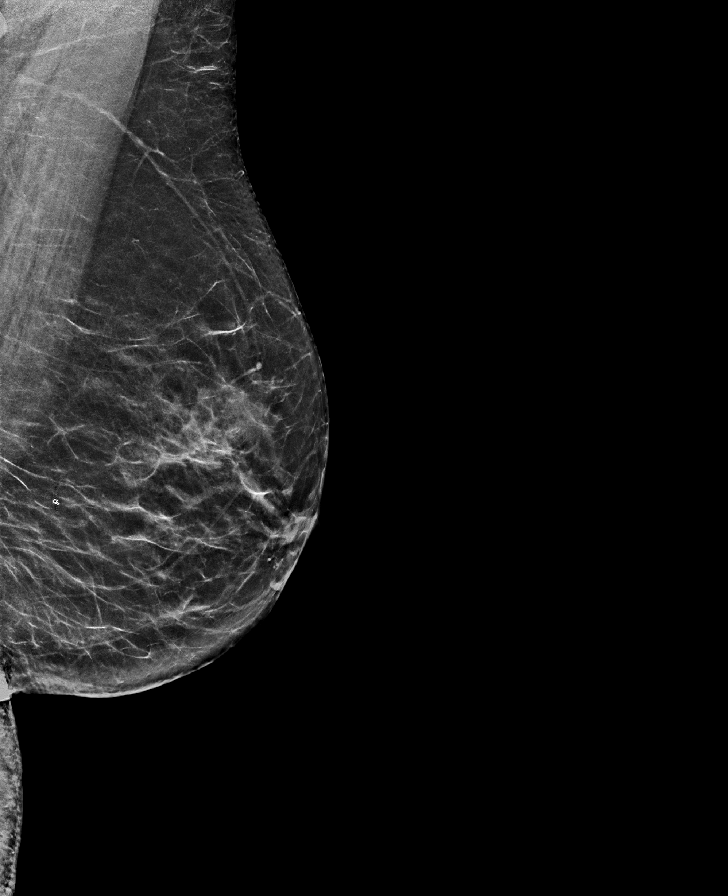

[R CC synth-2D]
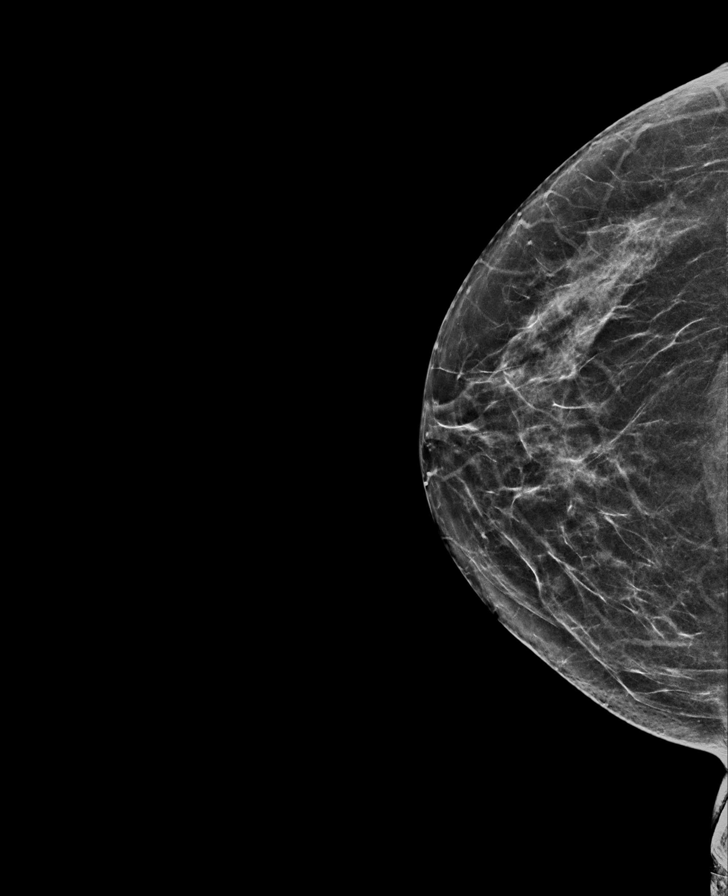

[R MLO synth-2D]
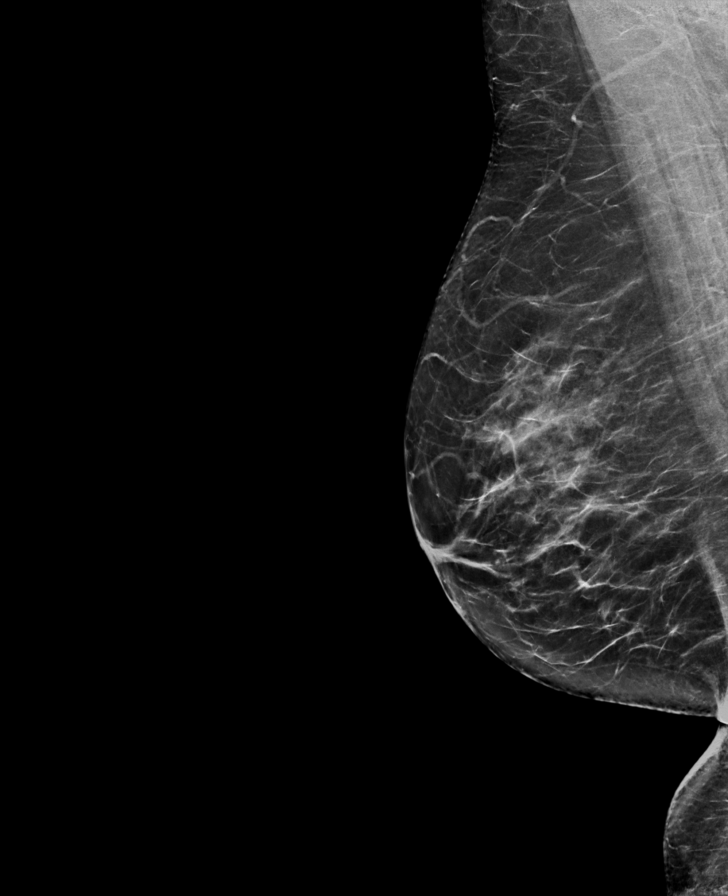

[L CC tomo · tomo slice 32/63.0]
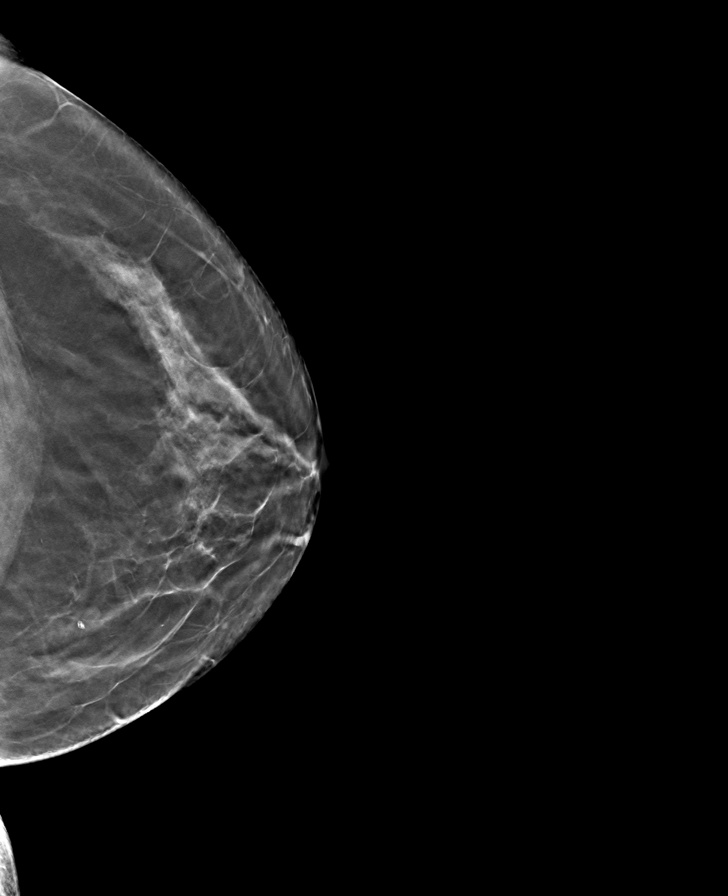

[R CC tomo · tomo slice 31/60.0]
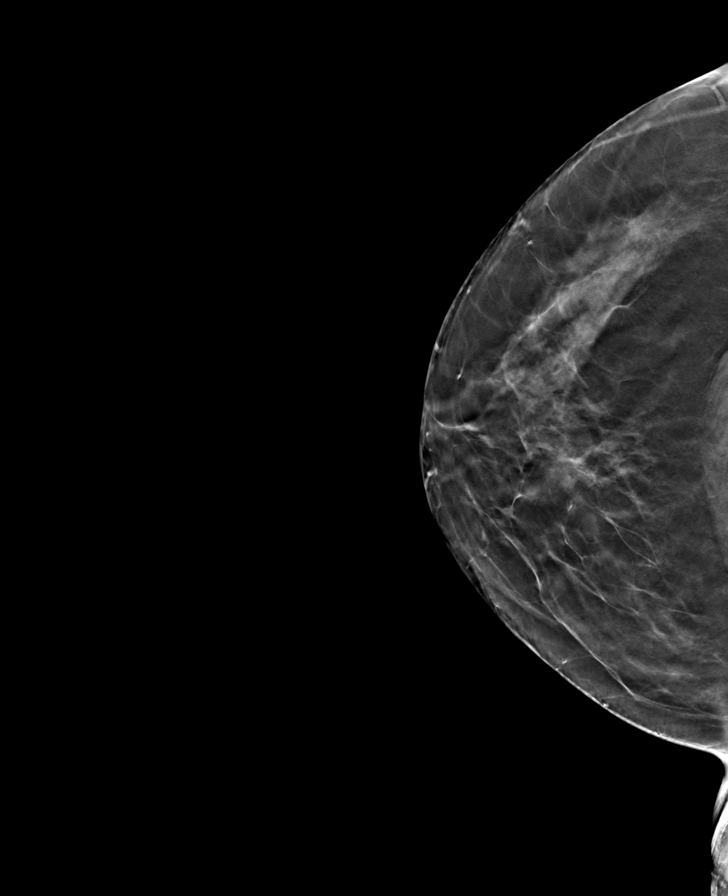

[L MLO tomo · tomo slice 33/64.0]
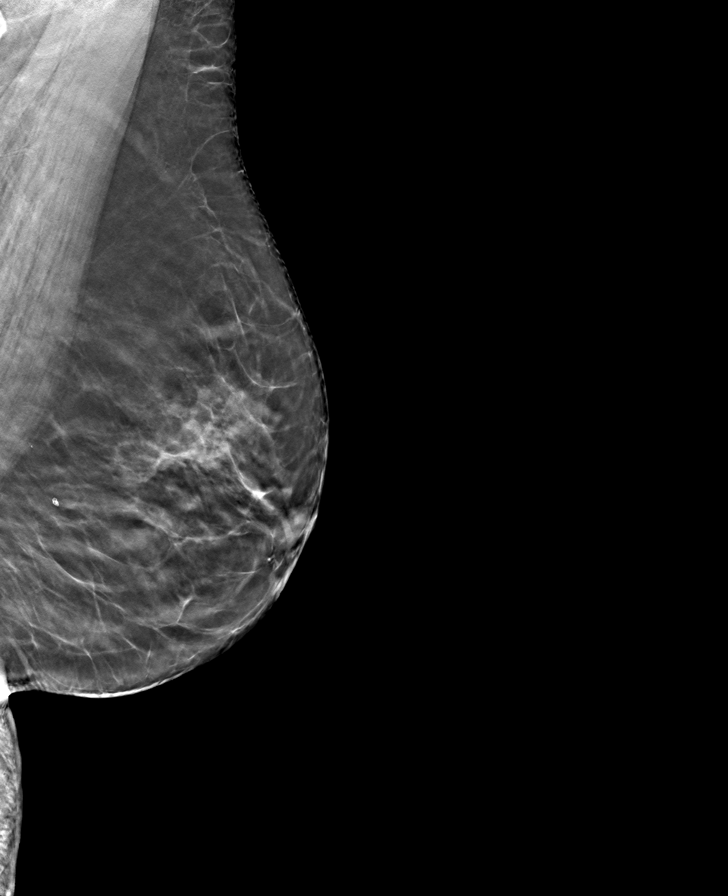

[R MLO tomo · tomo slice 35/70.0]
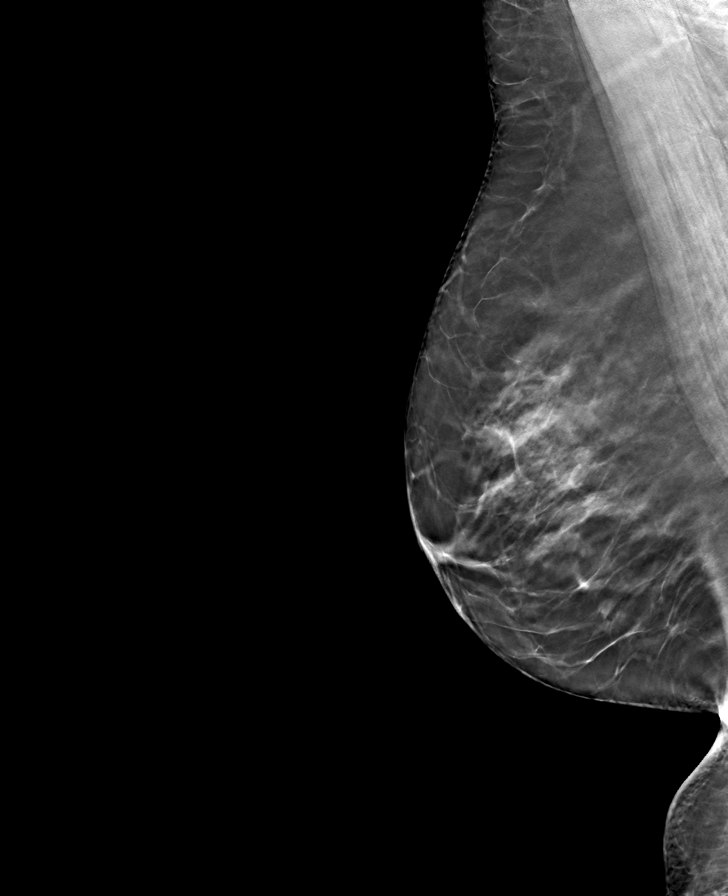

[8 of 24 positions shown; findings below may reference images not displayed]

ACR Breast Density Category c: The breast tissue is heterogeneously
dense, which may obscure small masses.
FINDINGS: There are no findings suspicious for malignancy. Images were
processed with CAD.
IMPRESSION: No mammographic evidence of malignancy. A result letter of this
screening mammogram will be mailed directly to the patient.

RECOMMENDATION:
Screening mammogram in one year. (Code:FT-U-LHB)

BI-RADS CATEGORY  1: Negative.

## 2021-07-06 ENCOUNTER — Other Ambulatory Visit: Payer: Self-pay | Admitting: Family Medicine

## 2021-07-06 DIAGNOSIS — Z1231 Encounter for screening mammogram for malignant neoplasm of breast: Secondary | ICD-10-CM

## 2021-07-28 ENCOUNTER — Ambulatory Visit
Admission: RE | Admit: 2021-07-28 | Discharge: 2021-07-28 | Disposition: A | Discharge status: Home / Self Care | Payer: BC Managed Care – PPO | Source: Ambulatory Visit | Attending: Family Medicine | Admitting: Family Medicine

## 2021-07-28 ENCOUNTER — Other Ambulatory Visit: Payer: Self-pay

## 2021-07-28 DIAGNOSIS — Z1231 Encounter for screening mammogram for malignant neoplasm of breast: Secondary | ICD-10-CM | POA: Insufficient documentation

## 2021-08-02 ENCOUNTER — Ambulatory Visit
Admission: EM | Admit: 2021-08-02 | Discharge: 2021-08-02 | Disposition: A | Discharge status: Home / Self Care | Payer: BC Managed Care – PPO

## 2021-08-02 ENCOUNTER — Other Ambulatory Visit: Payer: Self-pay

## 2021-08-02 ENCOUNTER — Ambulatory Visit (INDEPENDENT_AMBULATORY_CARE_PROVIDER_SITE_OTHER): Payer: BC Managed Care – PPO

## 2021-08-02 DIAGNOSIS — J019 Acute sinusitis, unspecified: Secondary | ICD-10-CM

## 2021-08-02 DIAGNOSIS — J209 Acute bronchitis, unspecified: Secondary | ICD-10-CM

## 2021-08-02 DIAGNOSIS — R062 Wheezing: Secondary | ICD-10-CM

## 2021-08-02 DIAGNOSIS — R0602 Shortness of breath: Secondary | ICD-10-CM | POA: Diagnosis not present

## 2021-08-02 DIAGNOSIS — R059 Cough, unspecified: Secondary | ICD-10-CM | POA: Diagnosis not present

## 2021-08-02 DIAGNOSIS — R051 Acute cough: Secondary | ICD-10-CM | POA: Diagnosis not present

## 2021-08-02 MED ORDER — PROMETHAZINE-DM 6.25-15 MG/5ML PO SYRP
5.0000 mL | ORAL_SOLUTION | Freq: Three times a day (TID) | ORAL | 0 refills | Status: DC | PRN
Start: 2021-08-02 — End: 2022-09-29

## 2021-08-02 MED ORDER — DOXYCYCLINE HYCLATE 100 MG PO CAPS: 100.0000 mg | ORAL_CAPSULE | Freq: Two times a day (BID) | ORAL | 0 refills | Status: AC

## 2021-08-02 MED ORDER — ALBUTEROL SULFATE HFA 108 (90 BASE) MCG/ACT IN AERS: 1.0000 | INHALATION_SPRAY | Freq: Four times a day (QID) | RESPIRATORY_TRACT | 0 refills | Status: DC | PRN

## 2021-08-02 NOTE — Discharge Instructions (Signed)
-  Your chest x-ray does not show any evidence of pneumonia.  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  Continue to follow this with your PCP. -Symptoms consistent with bronchitis and sinusitis.  This could be due to a viral illness.  In which case it may take a couple more weeks to resolve.  However, it could also be due to a bacterial cause.  I have sent doxycycline antibiotic to pharmacy.  Have also sent Promethazine DM to pharmacy.  Stop taking the cough medication if you experience any side effects or if you feel too drowsy while taking that and trazodone. - Increase rest and fluids.  Consider use of cough drops. - I have sent an inhaler for you to use in case you need it. - If you have a fever, increased chest pain, worsening cough or increased shortness of breath or any increased worsening of symptoms you should be seen again immediately. - Go to ER for any severe acute worsening of any your symptoms.

## 2021-08-02 NOTE — ED Triage Notes (Signed)
Pt with one week of sinus and nasal congestion.  Coughing up [place] to yellow to green sputum. Ears popping, teeth hurt and facial pain. States worse is cough keeping her up at night and called primary but no cough medication called in.

## 2021-08-02 NOTE — ED Provider Notes (Signed)
[facility]    CSN: [id number] Arrival date & time: 08/02/21  0263      History   Chief Complaint Chief Complaint  Patient presents with   Nasal Congestion   Cough   Dental Pain   Facial Pain    HPI Rebecca Cobb is a 60 y.o. female presenting for a little over 1 week history of nasal congestion and cough.  Cough is productive and keeping her up at night.  Patient reports over the past 3 to 4 days her cough is gotten worse and she also has noted to have facial pain and pressure.  Reports pain into her teeth.  Additionally reports "bubbling" in chest.  Reports feeling short of breath especially when she is trying to go upstairs.  Reports shortness of breath when she gets into a coughing fit.  Patient has not taken any cough medication because she is unsure of what to take since she takes trazodone at nighttime for insomnia, depression and anxiety.  Patient denies any sick contacts or known exposure to COVID-19 or influenza.  No history of respiratory conditions.  No other complaints.  HPI  Past Medical History:  Diagnosis Date   Arthritis    fingers   Cancer ([place])    skin ca - face, upper chest   Claustrophobia    severe,Caused by laying flat or "things on face", panic attacks   Claustrophobia    Depression    no meds for 6 years   GERD (gastroesophageal reflux disease)    no meds   Headache    occasional now   Hyperlipidemia    Motion sickness    car - passenger   Motion sickness    in vehicle   Multiple skin nodules    FATTY DEPOSITS   Seasonal allergies     Patient Active Problem List   Diagnosis Date Noted   Polyp of ascending colon    Dysplastic nevus 02/21/2017   History of colon polyps 02/21/2017   Special screening for malignant neoplasms, colon    Rectal polyp    Benign neoplasm of cecum    Blood pressure elevated 07/29/2015   Incomplete tear of left rotator cuff 01/21/2015   Impingement syndrome of left shoulder 10/08/2014   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   Hypercholesterolemia 03/15/2011   Obesity (BMI 30.0-34.9) 03/15/2011    Past Surgical History:  Procedure Laterality Date   BREAST BIOPSY Right    neg   BREAST CYST EXCISION Left    BENIGN-marker in breast   CESAREAN SECTION     X3   COLONOSCOPY WITH PROPOFOL N/A 11/29/2016   Procedure: COLONOSCOPY WITH PROPOFOL;  Surgeon: Lucilla Lame, MD;  Location: [facility];  Service: Endoscopy;  Laterality: N/A;   COLONOSCOPY WITH PROPOFOL N/A 02/07/2020   Procedure: COLONOSCOPY WITH PROPOFOL, POLYPECTOMY;  Surgeon: Lucilla Lame, MD;  Location: [place];  Service: Endoscopy;  Laterality: N/A;   DILATION AND CURETTAGE OF UTERUS     liver cysts  08/29/2018   benigh, removed by surgery laparoscopy at [place] N/A 09/23/2014   Procedure: MRI;  Surgeon: Medication Radiologist, MD;  Location: [place];  Service: Radiology;  Laterality: N/A;   RADIOLOGY WITH ANESTHESIA Left 01/08/2015   Procedure: RADIOLOGY WITH ANESTHESIA/LEFT SHOULDER MRI;  Surgeon: Medication Radiologist, MD;  Location: [place];  Service: Radiology;  Laterality: Left;  DR. Lahoma Rocker   RADIOLOGY WITH ANESTHESIA Left 11/17/2015   Procedure: LEFT SHOULDER MRI;  Surgeon: Medication Radiologist, MD;  Location: [place];  Service: Radiology;  Laterality: Left;   SHOULDER ARTHROSCOPY WITH OPEN ROTATOR CUFF REPAIR AND DISTAL CLAVICLE ACROMINECTOMY Left 03/06/2015   Procedure: SHOULDER ARTHROSCOPY WITH RELEASE OF LONG HEAD OF BICEPS TENDON AND DECOMPRESSION;  Surgeon: Leanor Kail, MD;  Location: [place];  Service: Orthopedics;  Laterality: Left;   WISDOM TOOTH EXTRACTION      OB History   No obstetric history on file.      Home Medications    Prior to Admission medications   Medication Sig Start Date End Date Taking? Authorizing Provider  albuterol (VENTOLIN HFA) 108 (90 Base) MCG/ACT inhaler Inhale 1-2 puffs into the lungs every 6 (six) hours as  needed for wheezing or shortness of breath. 08/02/21  Yes Laurene Footman B, PA-C  doxycycline (VIBRAMYCIN) 100 MG capsule Take 1 capsule (100 mg total) by mouth 2 (two) times daily for 7 days. 08/02/21 08/09/21 Yes Danton Clap, PA-C  promethazine-dextromethorphan (PROMETHAZINE-DM) 6.25-15 MG/5ML syrup Take 5 mLs by mouth 3 (three) times daily as needed for cough. 08/02/21  Yes Danton Clap, PA-C  traZODone (DESYREL) 50 MG tablet Take 100 mg by mouth at bedtime as needed. 08/31/20  Yes [provider]  acetaminophen (TYLENOL) 500 MG tablet Take 500-1,000 mg by mouth every 6 (six) hours as needed (pain).     [provider]  atorvastatin (LIPITOR) 20 MG tablet Take 20 mg by mouth daily. AM 07/13/14   [provider]  levocetirizine (XYZAL) 5 MG tablet Take 5 mg by mouth every evening.    [provider]  Multiple Vitamin (MULTIVITAMIN) tablet Take 1 tablet by mouth daily. AM    [provider]  sertraline (ZOLOFT) 100 MG tablet Take 150 mg by mouth daily.     [provider]    Family History Family History  Problem Relation Age of Onset   Breast cancer Neg Hx     Social History Social History   Tobacco Use   Smoking status: Former    Packs/day: 0.25    Years: 20.00    Pack years: 5.00    Types: Cigarettes    Quit date: 04/06/2006    Years since quitting: 15.3   Smokeless tobacco: Never  Vaping Use   Vaping Use: Never used  Substance Use Topics   Alcohol use: Yes    Alcohol/week: 5.0 - 6.0 standard drinks    Types: 5 - 6 Cans of beer per week    Comment: social   Drug use: No     Allergies   Codeine, Lactose, Lactose intolerance (gi), Other, Tape, and Celecoxib   Review of Systems Review of Systems  Constitutional:  Positive for fatigue. Negative for chills, diaphoresis and fever.  HENT:  Positive for congestion, rhinorrhea, sinus pressure and sinus pain. Negative for ear pain and sore throat.   Respiratory:   Positive for cough, shortness of breath and wheezing.   Cardiovascular:  Negative for chest pain.  Gastrointestinal:  Negative for abdominal pain, nausea and vomiting.  Musculoskeletal:  Negative for arthralgias and myalgias.  Skin:  Negative for rash.  Neurological:  Negative for weakness and headaches.  Hematological:  Negative for adenopathy.  Psychiatric/Behavioral:  Positive for sleep disturbance (due to cough).     Physical Exam Triage Vital Signs ED Triage Vitals  Enc Vitals Group     BP      Pulse      Resp  Temp      Temp src      SpO2      Weight      Height      Head Circumference      Peak Flow      Pain Score      Pain Loc      Pain Edu?      Excl. in [place]?    No data found.  Updated Vital Signs BP (!) 136/59 (BP Location: Left Arm)   Pulse 71   Temp 98.7 F (37.1 C) (Oral)   Resp 17   Ht 5\' 3"  (1.6 m)   Wt 180 lb (81.6 kg)   LMP  (LMP Unknown)   SpO2 99%   BMI 31.89 kg/m    Physical Exam Vitals and nursing note reviewed.  Constitutional:      General: She is not in acute distress.    Appearance: Normal appearance. She is not ill-appearing or toxic-appearing.  HENT:     Head: Normocephalic and atraumatic.     Right Ear: Tympanic membrane, ear canal and external ear normal.     Left Ear: Tympanic membrane, ear canal and external ear normal.     Nose: Congestion present.     Mouth/Throat:     Mouth: Mucous membranes are moist.     Pharynx: Oropharynx is clear.  Eyes:     General: No scleral icterus.       Right eye: No discharge.        Left eye: No discharge.     Conjunctiva/sclera: Conjunctivae normal.  Cardiovascular:     Rate and Rhythm: Normal rate and regular rhythm.     Heart sounds: Normal heart sounds.  Pulmonary:     Effort: Pulmonary effort is normal. No respiratory distress.     Breath sounds: Wheezing (LML, LUL) present.  Musculoskeletal:     Cervical back: Neck supple.  Skin:    General: Skin is dry.  Neurological:      General: No focal deficit present.     Mental Status: She is alert. Mental status is at baseline.     Motor: No weakness.     Gait: Gait normal.  Psychiatric:        Mood and Affect: Mood normal.        Behavior: Behavior normal.        Thought Content: Thought content normal.     UC Treatments / Results  Labs (all labs ordered are listed, but only abnormal results are displayed) Labs Reviewed - No data to display  EKG   Radiology DG Chest 2 View  Result Date: 08/02/2021 CLINICAL DATA:  A 60 year old female presents with cough, wheezing and shortness of breath. EXAM: CHEST - 2 VIEW COMPARISON:  August 27, 2018. FINDINGS: No signs of consolidation or effusion. Cardiomediastinal contours and hilar structures are stable. Bandlike scarring in the RIGHT mid chest and LEFT lower lobe with similar appearance to prior imaging. On limited assessment there is no acute skeletal process. IMPRESSION: Bandlike scarring in the RIGHT mid chest and LEFT lower lobe, unchanged from prior imaging. No acute cardiopulmonary findings. Electronically Signed   By: Zetta Bills M.D.   On: 08/02/2021 09:21    Procedures Procedures (including critical care time)  Medications Ordered in UC Medications - No data to display  Initial Impression / Assessment and Plan / UC Course  I have reviewed the triage vital signs and the nursing notes.  Pertinent labs & imaging  results that were available during my care of the patient were reviewed by me and considered in my medical decision making (see chart for details).  60 year old female presenting for greater than 1 week history of nasal congestion and cough.  Acute worsening of symptoms over the past 3 to 4 days.  Reports wheezing and shortness of breath.  Vitals are stable today.  Patient mildly ill-appearing.  Patient has nasal congestion on exam as well as wheezing of left middle and upper lobes.  Chest x-ray obtained to assess for possible pneumonia.   Chest x-ray does not reveal any evidence of pneumonia.  There is scarring in the lungs at a couple different areas but this is unchanged from study performed in 2019.  Patient has had CT angiogram since.  Independently reviewed chest x-ray.  Advised patient results of chest x-ray.  Will treat for acute sinusitis and bronchitis with doxycycline.  I will also sent in ProAir and Promethazine DM.  Advised to monitor closely while taking the Promethazine DM with trazodone.  Advised to discontinue she experiences any side effects or starts to feel too drowsy or worse.  Thoroughly reviewed return and ED precautions with patient.   Final Clinical Impressions(s) / UC Diagnoses   Final diagnoses:  Acute bronchitis, unspecified organism  Acute sinusitis, recurrence not specified, unspecified location  Acute cough  Wheezing     Discharge Instructions      -Your chest x-ray does not show any evidence of pneumonia.  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  Continue to follow this with your PCP. -Symptoms consistent with bronchitis and sinusitis.  This could be due to a viral illness.  In which case it may take a couple more weeks to resolve.  However, it could also be due to a bacterial cause.  I have sent doxycycline antibiotic to pharmacy.  Have also sent Promethazine DM to pharmacy.  Stop taking the cough medication if you experience any side effects or if you feel too drowsy while taking that and trazodone. - Increase rest and fluids.  Consider use of cough drops. - I have sent an inhaler for you to use in case you need it. - If you have a fever, increased chest pain, worsening cough or increased shortness of breath or any increased worsening of symptoms you should be seen again immediately. - Go to ER for any severe acute worsening of any your symptoms.     ED Prescriptions     Medication Sig Dispense Auth. Provider   doxycycline (VIBRAMYCIN) 100 MG  capsule Take 1 capsule (100 mg total) by mouth 2 (two) times daily for 7 days. 14 capsule Danton Clap, PA-C   promethazine-dextromethorphan (PROMETHAZINE-DM) 6.25-15 MG/5ML syrup Take 5 mLs by mouth 3 (three) times daily as needed for cough. 118 mL Laurene Footman B, PA-C   albuterol (VENTOLIN HFA) 108 (90 Base) MCG/ACT inhaler Inhale 1-2 puffs into the lungs every 6 (six) hours as needed for wheezing or shortness of breath. 1 g Danton Clap, PA-C      PDMP not reviewed this encounter.   Danton Clap, PA-C 08/02/21 [phone]

## 2022-07-21 ENCOUNTER — Other Ambulatory Visit: Payer: Self-pay | Admitting: Family Medicine

## 2022-07-21 DIAGNOSIS — Z1231 Encounter for screening mammogram for malignant neoplasm of breast: Secondary | ICD-10-CM

## 2022-08-03 ENCOUNTER — Ambulatory Visit
Admission: RE | Admit: 2022-08-03 | Discharge: 2022-08-03 | Disposition: A | Discharge status: Home / Self Care | Payer: BC Managed Care – PPO | Source: Ambulatory Visit | Attending: Family Medicine | Admitting: Family Medicine

## 2022-08-03 DIAGNOSIS — Z1231 Encounter for screening mammogram for malignant neoplasm of breast: Secondary | ICD-10-CM | POA: Insufficient documentation

## 2022-08-17 ENCOUNTER — Telehealth: Payer: Self-pay

## 2022-08-17 NOTE — Telephone Encounter (Signed)
Colonoscopy is not due.  Patient had her last colonoscopy with Dr. Allen Norris 02/07/2120.  Results letter dated 02/11/20 noted to repeat colonoscopy in 5 years.  Colonoscopy report noted repeat in 5 years.  She has been informed that she is due to have her colonoscopy until 2026.  Results letter printed w/ colonoscopy report for patient to have for her records.  Thanks,  [place], [place]

## 2022-09-14 ENCOUNTER — Other Ambulatory Visit: Payer: Self-pay | Admitting: Family Medicine

## 2022-09-14 DIAGNOSIS — R42 Dizziness and giddiness: Secondary | ICD-10-CM

## 2022-09-16 ENCOUNTER — Ambulatory Visit: Payer: BC Managed Care – PPO

## 2022-09-22 ENCOUNTER — Ambulatory Visit
Admission: RE | Admit: 2022-09-22 | Discharge: 2022-09-22 | Disposition: A | Discharge status: Home / Self Care | Payer: BC Managed Care – PPO | Source: Ambulatory Visit | Attending: Family Medicine | Admitting: Family Medicine

## 2022-09-22 DIAGNOSIS — R42 Dizziness and giddiness: Secondary | ICD-10-CM

## 2022-09-23 ENCOUNTER — Other Ambulatory Visit: Payer: Self-pay | Admitting: Family Medicine

## 2022-09-23 DIAGNOSIS — R42 Dizziness and giddiness: Secondary | ICD-10-CM

## 2022-09-23 DIAGNOSIS — G9389 Other specified disorders of brain: Secondary | ICD-10-CM

## 2022-10-03 ENCOUNTER — Other Ambulatory Visit: Payer: Self-pay

## 2022-10-03 ENCOUNTER — Encounter (HOSPITAL_COMMUNITY): Payer: Self-pay

## 2022-10-03 NOTE — Progress Notes (Signed)
PCP - Dr. Hortencia Pilar Cardiologist - Denies  PPM/ICD - Denies  Chest x-ray - N/A EKG - N/A Stress Test - Denies ECHO - Denies Cardiac Cath - Denies  Sleep Study - Denies  Diabetes: Denies  Blood Thinner Instructions: N/A Aspirin Instructions: N/A  ERAS Protcol - Yes PRE-SURGERY Ensure or G2- No  COVID TEST- N/A   Anesthesia review: No  Patient denies shortness of breath, fever, cough and chest pain at PAT appointment   All instructions explained to the patient, with a verbal understanding of the material. Patient agrees to go over the instructions while at home for a better understanding. Patient also instructed to self quarantine after being tested for COVID-19. The opportunity to ask questions was provided.    Surgical Instructions    Your procedure is scheduled on Tuesday, October 04, 2022 at 10:00 AM.  Report to [facility] Main Entrance "A" at 7:30 A.M., then check in with the Admitting office.  Call this number if you have problems the morning of surgery:  [phone]   If you have any questions prior to your surgery date call [phone]: Open Monday-Friday 8am-4pm  *If you experience any cold or flu symptoms such as cough, fever, chills, shortness of breath, etc. between now and your scheduled surgery, please notify us.*    Remember:  Do not eat after midnight the night before your surgery  You may drink clear liquids until 7:00 AM the morning of your surgery.   Clear liquids allowed are: Water, Non-Citrus Juices (without pulp), Carbonated Beverages, Clear Tea, Black Coffee Only (NO MILK, CREAM OR POWDERED CREAMER of any kind), and Gatorade.    Take these medicines the morning of surgery with A SIP OF WATER:  atorvastatin (LIPITOR)  sertraline (ZOLOFT)  acetaminophen (TYLENOL) - if needed  As of today, STOP taking any Aspirin (unless otherwise instructed by your surgeon) Aleve, Naproxen, Ibuprofen, Motrin, Advil, Goody's, BC's, all herbal  medications, fish oil, and all vitamins.                     Do NOT Smoke (Tobacco/Vaping) for 24 hours prior to your procedure.  If you use a CPAP at night, you may bring your mask/headgear for your overnight stay.   Contacts, glasses, piercing's, hearing aid's, dentures or partials may not be worn into surgery, please bring cases for these belongings.    For patients admitted to the hospital, discharge time will be determined by your treatment team.   Patients discharged the day of surgery will not be allowed to drive home, and someone needs to stay with them for 24 hours.  SURGICAL WAITING ROOM VISITATION Patients having surgery or a procedure may have two support people in the waiting area. Visitors may stay in the waiting area during the procedure and switch out with other visitors if needed. Only 1 support person is allowed in the pre-op area with the patient AFTER the patient is prepped. This person cannot be switched out. Children under the age of 28 must have an adult accompany them who is not the patient. If the patient needs to stay at the hospital during part of their recovery, the visitor guidelines for inpatient rooms apply.  Please refer to the [facility] website for the visitor guidelines for Inpatients (after your surgery is over and you are in a regular room).    Special instructions:   Center- Preparing For Surgery  Day of Surgery: Take a shower with mild soap. Do  not wear jewelry or makeup Do not wear lotions, powders, perfumes/colognes, or deodorant. Do not shave 48 hours prior to surgery. Do not wear nail polish, gel polish, artificial nails, or any other type of covering on natural nails (fingers and toes) If you have artificial nails or gel coating that need to be removed by a nail salon, please have this removed prior to surgery. Artificial nails or gel coating may interfere with anesthesia's ability to adequately monitor your vital signs. Wear  Clean/Comfortable clothing the morning of surgery Do not bring valuables to the hospital.  [facility] is not responsible for any belongings or valuables. Remember to brush your teeth WITH YOUR REGULAR TOOTHPASTE.   Please read over the following fact sheets that you were given.  If you received a COVID test during your pre-op visit  it is requested that you wear a mask when out in public, stay away from anyone that may not be feeling well and notify your surgeon if you develop symptoms. If you have been in contact with anyone that has tested positive in the last 10 days please notify you surgeon.

## 2022-10-04 ENCOUNTER — Ambulatory Visit (HOSPITAL_COMMUNITY): Payer: BC Managed Care – PPO | Admitting: Certified Registered Nurse Anesthetist

## 2022-10-04 ENCOUNTER — Encounter (HOSPITAL_COMMUNITY)
Admission: RE | Disposition: A | Discharge status: Home / Self Care | Payer: Self-pay | Source: Ambulatory Visit | Attending: Family Medicine

## 2022-10-04 ENCOUNTER — Encounter (HOSPITAL_COMMUNITY): Payer: Self-pay | Admitting: Family Medicine

## 2022-10-04 ENCOUNTER — Ambulatory Visit (HOSPITAL_COMMUNITY)
Admission: RE | Admit: 2022-10-04 | Discharge: 2022-10-04 | Disposition: A | Discharge status: Home / Self Care | Payer: BC Managed Care – PPO | Source: Ambulatory Visit | Attending: Family Medicine | Admitting: Family Medicine

## 2022-10-04 DIAGNOSIS — R42 Dizziness and giddiness: Secondary | ICD-10-CM | POA: Insufficient documentation

## 2022-10-04 DIAGNOSIS — G9389 Other specified disorders of brain: Secondary | ICD-10-CM

## 2022-10-04 HISTORY — PX: RADIOLOGY WITH ANESTHESIA: SHX6223

## 2022-10-04 SURGERY — MRI WITH ANESTHESIA
Anesthesia: General

## 2022-10-04 MED ORDER — DEXAMETHASONE SODIUM PHOSPHATE 10 MG/ML IJ SOLN
INTRAMUSCULAR | Status: DC | PRN
  Administered 2022-10-04: 10 mg via INTRAVENOUS

## 2022-10-04 MED ORDER — ORAL CARE MOUTH RINSE: 15.0000 mL | Freq: Once | OROMUCOSAL | Status: DC

## 2022-10-04 MED ORDER — ONDANSETRON HCL 4 MG/2ML IJ SOLN
INTRAMUSCULAR | Status: DC | PRN
  Administered 2022-10-04: 4 mg via INTRAVENOUS

## 2022-10-04 MED ORDER — PROPOFOL 10 MG/ML IV BOLUS
INTRAVENOUS | Status: DC | PRN
  Administered 2022-10-04: 200 mg via INTRAVENOUS

## 2022-10-04 MED ORDER — LACTATED RINGERS IV SOLN: INTRAVENOUS | Status: DC

## 2022-10-04 MED ORDER — LIDOCAINE 2% (20 MG/ML) 5 ML SYRINGE
INTRAMUSCULAR | Status: DC | PRN
  Administered 2022-10-04: 60 mg via INTRAVENOUS

## 2022-10-04 MED ORDER — CHLORHEXIDINE GLUCONATE 0.12 % MT SOLN: 15.0000 mL | Freq: Once | OROMUCOSAL | Status: DC

## 2022-10-04 MED ORDER — GADOBUTROL 1 MMOL/ML IV SOLN
8.0000 mL | Freq: Once | INTRAVENOUS | Status: AC | PRN
  Administered 2022-10-04: 8 mL via INTRAVENOUS

## 2022-10-04 MED ORDER — MIDAZOLAM HCL 5 MG/5ML IJ SOLN
INTRAMUSCULAR | Status: DC | PRN
  Administered 2022-10-04: 2 mg via INTRAVENOUS

## 2022-10-04 NOTE — Anesthesia Preprocedure Evaluation (Signed)
Anesthesia Evaluation  Patient identified by MRN, date of birth, ID band Patient awake    Reviewed: Allergy & Precautions, NPO status , Patient's Chart, lab work & pertinent test results  Airway Mallampati: II  TM Distance: >3 FB Neck ROM: Full    Dental no notable dental hx.    Pulmonary neg pulmonary ROS, former smoker   Pulmonary exam normal breath sounds clear to auscultation       Cardiovascular negative cardio ROS Normal cardiovascular exam Rhythm:Regular Rate:Normal     Neuro/Psych  Headaches PSYCHIATRIC DISORDERS Anxiety Depression    Severe claustrophobia    GI/Hepatic Neg liver ROS,GERD  Controlled,,  Endo/Other  negative endocrine ROS    Renal/GU negative Renal ROS  negative genitourinary   Musculoskeletal  (+) Arthritis , Osteoarthritis,    Abdominal   Peds  Hematology negative hematology ROS (+)   Anesthesia Other Findings   Reproductive/Obstetrics negative OB ROS                             Anesthesia Physical Anesthesia Plan  ASA: 2  Anesthesia Plan: General   Post-op Pain Management:    Induction: Intravenous  PONV Risk Score and Plan: Ondansetron, Midazolam and Treatment may vary due to age or medical condition  Airway Management Planned: LMA  Additional Equipment: None  Intra-op Plan:   Post-operative Plan: Extubation in OR  Informed Consent: I have reviewed the patients History and Physical, chart, labs and discussed the procedure including the risks, benefits and alternatives for the proposed anesthesia with the patient or authorized representative who has indicated his/her understanding and acceptance.     Dental advisory given  Plan Discussed with: CRNA  Anesthesia Plan Comments:        Anesthesia Quick Evaluation

## 2022-10-04 NOTE — Anesthesia Postprocedure Evaluation (Signed)
Anesthesia Post Note  Patient: Rebecca Cobb  Procedure(s) Performed: MRI WITH ANESTHESIA OF BRAIN WITH AND WITHOUT CONTRAST     Patient location during evaluation: PACU Anesthesia Type: General Level of consciousness: awake and alert, oriented and patient cooperative Pain management: pain level controlled Vital Signs Assessment: post-procedure vital signs reviewed and stable Respiratory status: spontaneous breathing, nonlabored ventilation and respiratory function stable Cardiovascular status: blood pressure returned to baseline and stable Postop Assessment: no apparent nausea or vomiting Anesthetic complications: no   No notable events documented.  Last Vitals:  Vitals:   10/04/22 1200 10/04/22 1205  BP: 120/61   Pulse: (!) 59 (!) 56  Resp: 20 (!) 23  Temp:    SpO2: 95% 97%    Last Pain:  Vitals:   10/04/22 1205  TempSrc:   PainSc: 0-No pain                 Pervis Hocking

## 2022-10-04 NOTE — Anesthesia Procedure Notes (Signed)
Procedure Name: LMA Insertion Date/Time: 10/04/2022 10:27 AM  Performed by: Genelle Bal, CRNAPre-anesthesia Checklist: Patient identified, Emergency Drugs available, Suction available and Patient being monitored Patient Re-evaluated:Patient Re-evaluated prior to induction Oxygen Delivery Method: Circle system utilized Preoxygenation: Pre-oxygenation with 100% oxygen Induction Type: IV induction Ventilation: Mask ventilation without difficulty LMA: LMA inserted LMA Size: 4.0 Number of attempts: 1 Airway Equipment and Method: Bite block Placement Confirmation: positive ETCO2 Tube secured with: Tape Dental Injury: Teeth and Oropharynx as per pre-operative assessment

## 2022-10-04 NOTE — Transfer of Care (Signed)
Immediate Anesthesia Transfer of Care Note  Patient: Rebecca Cobb  Procedure(s) Performed: MRI WITH ANESTHESIA OF BRAIN WITH AND WITHOUT CONTRAST  Patient Location: PACU  Anesthesia Type:General  Level of Consciousness: awake, alert , and oriented  Airway & Oxygen Therapy: Patient Spontanous Breathing and Patient connected to nasal cannula oxygen  Post-op Assessment: Report given to RN and Post -op Vital signs reviewed and stable  Post vital signs: Reviewed and stable  Last Vitals:  Vitals Value Taken Time  BP 130/83 10/04/22 1141  Temp    Pulse 70 10/04/22 1143  Resp 17 10/04/22 1143  SpO2 95 % 10/04/22 1143  Vitals shown include unvalidated device data.  Last Pain:  Vitals:   10/04/22 0839  TempSrc:   PainSc: 2       Patients Stated Pain Goal: 0 (32/41/99 1444)  Complications: No notable events documented.

## 2022-10-05 ENCOUNTER — Encounter (HOSPITAL_COMMUNITY): Payer: Self-pay | Admitting: Radiology

## 2022-11-25 ENCOUNTER — Ambulatory Visit
Admission: RE | Admit: 2022-11-25 | Discharge: 2022-11-25 | Disposition: A | Discharge status: Home / Self Care | Payer: BC Managed Care – PPO | Source: Ambulatory Visit | Attending: Emergency Medicine | Admitting: Emergency Medicine

## 2022-11-25 VITALS — BP 123/82 | HR 73 | Temp 99.1°F | Resp 18

## 2022-11-25 DIAGNOSIS — B349 Viral infection, unspecified: Secondary | ICD-10-CM | POA: Diagnosis not present

## 2022-11-25 LAB — POCT RAPID STREP A (OFFICE): Rapid Strep A Screen: NEGATIVE

## 2022-11-25 MED ORDER — BENZONATATE 100 MG PO CAPS: 100.0000 mg | ORAL_CAPSULE | Freq: Three times a day (TID) | ORAL | 0 refills | Status: DC

## 2022-11-25 MED ORDER — IPRATROPIUM BROMIDE 0.03 % NA SOLN: 2.0000 | Freq: Two times a day (BID) | NASAL | 12 refills | Status: DC

## 2022-11-25 NOTE — ED Triage Notes (Signed)
Sore throat, headache, chills, and ear pain that started Wednesday. Taking tylenol with little relief of symptoms.

## 2022-11-25 NOTE — Discharge Instructions (Signed)
Your symptoms today are most likely being caused by a virus and should steadily improve in time it can take up to 7 to 10 days before you truly start to see a turnaround however things will get better  Rapid strep test is negative  You may try liquid Benadryl, gargle and spit to see if this will help to soothe your throat  You may use Tessalon pill every 8 hours to help calm your coughing, this would not make you drowsy  You may use nasal spray every morning and every evening to clear the sinuses which is most likely contributing to your pain    You can take Tylenol and/or Ibuprofen as needed for fever reduction and pain relief.   For cough: honey 1/2 to 1 teaspoon (you can dilute the honey in water or another fluid).  You can also use guaifenesin and dextromethorphan for cough. You can use a humidifier for chest congestion and cough.  If you don't have a humidifier, you can sit in the bathroom with the hot shower running.      For sore throat: try warm salt water gargles, cepacol lozenges, throat spray, warm tea or water with lemon/honey, popsicles or ice, or OTC cold relief medicine for throat discomfort.   For congestion: take a daily anti-histamine like Zyrtec, Claritin, and a oral decongestant, such as pseudoephedrine.  You can also use Flonase 1-2 sprays in each nostril daily.   It is important to stay hydrated: drink plenty of fluids (water, gatorade/powerade/pedialyte, juices, or teas) to keep your throat moisturized and help further relieve irritation/discomfort.

## 2022-11-25 NOTE — ED Provider Notes (Signed)
Roderic Palau    CSN: [id number] Arrival date & time: 11/25/22  [id number]      History   Chief Complaint Chief Complaint  Patient presents with   Sore Throat    I think I might have strep throat - Entered by patient    HPI Rebecca Cobb is a 62 y.o. female.   Patient presents for evaluation of a nonproductive cough, sore throat, intermittent generalized headaches, chills and left-sided ear pain beginning 3 days ago.  Throat is described as feeling as if it is on fire.  Tolerating food and liquids.  No known sick contacts.  has attempted use of Tylenol which has been minimally  Past Medical History:  Diagnosis Date   Arthritis    fingers   Cancer ([place])    skin ca - face, upper chest   Claustrophobia    severe,Caused by laying flat or "things on face", panic attacks   Claustrophobia    Depression    no meds for 6 years   GERD (gastroesophageal reflux disease)    no meds   Headache    occasional now   Hyperlipidemia    Motion sickness    car - passenger   Motion sickness    in vehicle   Multiple skin nodules    FATTY DEPOSITS   Seasonal allergies     Patient Active Problem List   Diagnosis Date Noted   Polyp of ascending colon    Dysplastic nevus 02/21/2017   History of colon polyps 02/21/2017   Special screening for malignant neoplasms, colon    Rectal polyp    Benign neoplasm of cecum    Blood pressure elevated 07/29/2015   Incomplete tear of left rotator cuff 01/21/2015   Impingement syndrome of left shoulder 10/08/2014   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   Hypercholesterolemia 03/15/2011   Obesity (BMI 30.0-34.9) 03/15/2011    Past Surgical History:  Procedure Laterality Date   BREAST BIOPSY Right    neg   BREAST CYST EXCISION Left    BENIGN-marker in breast   CESAREAN SECTION     X3   COLONOSCOPY WITH PROPOFOL N/A 11/29/2016   Procedure: COLONOSCOPY WITH PROPOFOL;  Surgeon: Lucilla Lame, MD;  Location: [facility];  Service:  Endoscopy;  Laterality: N/A;   COLONOSCOPY WITH PROPOFOL N/A 02/07/2020   Procedure: COLONOSCOPY WITH PROPOFOL, POLYPECTOMY;  Surgeon: Lucilla Lame, MD;  Location: [place];  Service: Endoscopy;  Laterality: N/A;   DILATION AND CURETTAGE OF UTERUS     liver cysts  08/29/2018   benigh, removed by surgery laparoscopy at [place] N/A 09/23/2014   Procedure: MRI;  Surgeon: Medication Radiologist, MD;  Location: [place];  Service: Radiology;  Laterality: N/A;   RADIOLOGY WITH ANESTHESIA Left 01/08/2015   Procedure: RADIOLOGY WITH ANESTHESIA/LEFT SHOULDER MRI;  Surgeon: Medication Radiologist, MD;  Location: [place];  Service: Radiology;  Laterality: Left;  DR. Lahoma Rocker   RADIOLOGY WITH ANESTHESIA Left 11/17/2015   Procedure: LEFT SHOULDER MRI;  Surgeon: Medication Radiologist, MD;  Location: [place];  Service: Radiology;  Laterality: Left;   RADIOLOGY WITH ANESTHESIA N/A 10/04/2022   Procedure: MRI WITH ANESTHESIA OF BRAIN WITH AND WITHOUT CONTRAST;  Surgeon: Radiologist, Medication, MD;  Location: [place];  Service: Radiology;  Laterality: N/A;   SHOULDER ARTHROSCOPY WITH OPEN ROTATOR CUFF REPAIR AND DISTAL CLAVICLE ACROMINECTOMY Left 03/06/2015   Procedure: SHOULDER ARTHROSCOPY WITH RELEASE OF LONG HEAD OF BICEPS TENDON AND DECOMPRESSION;  Surgeon: Leanor Kail, MD;  Location: [place];  Service: Orthopedics;  Laterality: Left;   WISDOM TOOTH EXTRACTION      OB History   No obstetric history on file.      Home Medications    Prior to Admission medications   Medication Sig Start Date End Date Taking? Authorizing Provider  acetaminophen (TYLENOL) 500 MG tablet Take 500-1,000 mg by mouth every 6 (six) hours as needed (pain).    Yes [provider]  atorvastatin (LIPITOR) 20 MG tablet Take 20 mg by mouth every morning. 07/13/14  Yes [provider]  benzonatate (TESSALON) 100 MG capsule Take 1 capsule (100 mg total) by mouth every 8  (eight) hours. 11/25/22  Yes Ingle, Danaja Lasota R, NP  ipratropium (ATROVENT) 0.03 % nasal spray Place 2 sprays into both nostrils every 12 (twelve) hours. 11/25/22  Yes Rohlman, Leitha Schuller, NP  Multiple Vitamin (MULTIVITAMIN) tablet Take 1 tablet by mouth every morning.   Yes [provider]  sertraline (ZOLOFT) 100 MG tablet Take 100 mg by mouth every morning.   Yes [provider]  traZODone (DESYREL) 50 MG tablet Take 100 mg by mouth at bedtime. 08/31/20  Yes [provider]    Family History Family History  Problem Relation Age of Onset   Breast cancer Neg Hx     Social History Social History   Tobacco Use   Smoking status: Former    Packs/day: 0.25    Years: 20.00    Additional pack years: 0.00    Total pack years: 5.00    Types: Cigarettes    Quit date: 04/06/2006    Years since quitting: 16.6   Smokeless tobacco: Never  Vaping Use   Vaping Use: Never used  Substance Use Topics   Alcohol use: Yes    Alcohol/week: 5.0 - 6.0 standard drinks of alcohol    Types: 5 - 6 Cans of beer per week    Comment: social   Drug use: No     Allergies   Codeine, Lactose, Lactose intolerance (gi), Other, Tape, and Celecoxib   Review of Systems Review of Systems   Physical Exam Triage Vital Signs ED Triage Vitals [11/25/22 0950]  Enc Vitals Group     BP 123/82     Pulse Rate 73     Resp 18     Temp 99.1 F (37.3 C)     Temp Source Oral     SpO2 97 %     Weight      Height      Head Circumference      Peak Flow      Pain Score 6     Pain Loc      Pain Edu?      Excl. in [place]?    No data found.  Updated Vital Signs BP 123/82 (BP Location: Right Arm)   Pulse 73   Temp 99.1 F (37.3 C) (Oral)   Resp 18   LMP  (LMP Unknown)   SpO2 97%   Visual Acuity Right Eye Distance:   Left Eye Distance:   Bilateral Distance:    Right Eye Near:   Left Eye Near:    Bilateral Near:     Physical Exam Constitutional:      Appearance: Normal  appearance. She is well-developed.  HENT:     Right Ear: Tympanic membrane and ear canal normal.     Left Ear: Tympanic membrane and ear canal normal.  Nose: No congestion or rhinorrhea.     Mouth/Throat:     Mouth: Mucous membranes are moist.     Pharynx: Posterior oropharyngeal erythema present.     Tonsils: No tonsillar exudate. 0 on the right. 0 on the left.  Eyes:     Extraocular Movements: Extraocular movements intact.  Cardiovascular:     Rate and Rhythm: Normal rate and regular rhythm.     Pulses: Normal pulses.     Heart sounds: Normal heart sounds.  Pulmonary:     Effort: Pulmonary effort is normal.     Breath sounds: Normal breath sounds.  Musculoskeletal:     Cervical back: Normal range of motion.  Lymphadenopathy:     Cervical: Cervical adenopathy present.  Skin:    General: Skin is warm and dry.  Neurological:     General: No focal deficit present.     Mental Status: She is alert and oriented to person, place, and time.      UC Treatments / Results  Labs (all labs ordered are listed, but only abnormal results are displayed) Labs Reviewed  POCT RAPID STREP A (OFFICE)    EKG   Radiology No results found.  Procedures Procedures (including critical care time)  Medications Ordered in UC Medications - No data to display  Initial Impression / Assessment and Plan / UC Course  I have reviewed the triage vital signs and the nursing notes.  Pertinent labs & imaging results that were available during my care of the patient were reviewed by me and considered in my medical decision making (see chart for details).  Viral illness  Patient is in no signs of distress nor toxic appearing.  Vital signs are stable.  Low suspicion for pneumonia, pneumothorax or bronchitis and therefore will defer imaging.  Rapid strep test negative.  Prescribed Tessalon and Atrovent nasal spray as sinus congestion is most likely causing ear pain.  Declined use of Magic mouthwash  due to numbing sensation, recommended liquid Benadryl as an alternative.May use additional over-the-counter medications as needed for supportive care.  May follow-up with urgent care as needed if symptoms persist or worsen.   Final Clinical Impressions(s) / UC Diagnoses   Final diagnoses:  Viral illness     Discharge Instructions      Your symptoms today are most likely being caused by a virus and should steadily improve in time it can take up to 7 to 10 days before you truly start to see a turnaround however things will get better  Rapid strep test is negative  You may try liquid Benadryl, gargle and spit to see if this will help to soothe your throat  You may use Tessalon pill every 8 hours to help calm your coughing, this would not make you drowsy  You may use nasal spray every morning and every evening to clear the sinuses which is most likely contributing to your pain    You can take Tylenol and/or Ibuprofen as needed for fever reduction and pain relief.   For cough: honey 1/2 to 1 teaspoon (you can dilute the honey in water or another fluid).  You can also use guaifenesin and dextromethorphan for cough. You can use a humidifier for chest congestion and cough.  If you don't have a humidifier, you can sit in the bathroom with the hot shower running.      For sore throat: try warm salt water gargles, cepacol lozenges, throat spray, warm tea or water with lemon/honey, popsicles or ice,  or OTC cold relief medicine for throat discomfort.   For congestion: take a daily anti-histamine like Zyrtec, Claritin, and a oral decongestant, such as pseudoephedrine.  You can also use Flonase 1-2 sprays in each nostril daily.   It is important to stay hydrated: drink plenty of fluids (water, gatorade/powerade/pedialyte, juices, or teas) to keep your throat moisturized and help further relieve irritation/discomfort.    ED Prescriptions     Medication Sig Dispense Auth. Provider   benzonatate  (TESSALON) 100 MG capsule Take 1 capsule (100 mg total) by mouth every 8 (eight) hours. 21 capsule Skeels, Deion Forgue R, NP   ipratropium (ATROVENT) 0.03 % nasal spray Place 2 sprays into both nostrils every 12 (twelve) hours. 30 mL Hans Eden, NP      PDMP not reviewed this encounter.   Hans Eden, NP 11/25/22 1026

## 2022-11-26 ENCOUNTER — Ambulatory Visit: Payer: Self-pay

## 2022-11-27 ENCOUNTER — Ambulatory Visit
Admission: RE | Admit: 2022-11-27 | Discharge: 2022-11-27 | Disposition: A | Discharge status: Home / Self Care | Payer: BC Managed Care – PPO | Source: Ambulatory Visit | Attending: Emergency Medicine | Admitting: Emergency Medicine

## 2022-11-27 VITALS — BP 128/74 | HR 86 | Temp 99.1°F | Resp 14 | Ht 64.0 in | Wt 177.0 lb

## 2022-11-27 DIAGNOSIS — B9689 Other specified bacterial agents as the cause of diseases classified elsewhere: Secondary | ICD-10-CM | POA: Diagnosis not present

## 2022-11-27 DIAGNOSIS — H109 Unspecified conjunctivitis: Secondary | ICD-10-CM

## 2022-11-27 DIAGNOSIS — J069 Acute upper respiratory infection, unspecified: Secondary | ICD-10-CM

## 2022-11-27 DIAGNOSIS — H6503 Acute serous otitis media, bilateral: Secondary | ICD-10-CM | POA: Diagnosis not present

## 2022-11-27 MED ORDER — AMOXICILLIN 500 MG PO CAPS: 500.0000 mg | ORAL_CAPSULE | Freq: Two times a day (BID) | ORAL | 0 refills | Status: AC

## 2022-11-27 MED ORDER — POLYMYXIN B-TRIMETHOPRIM 10000-0.1 UNIT/ML-% OP SOLN: 1.0000 [drp] | OPHTHALMIC | 0 refills | Status: DC

## 2022-11-27 NOTE — ED Triage Notes (Addendum)
Patient was seen at Urgent Care in [place] on 11/25/22.  Patient has had cough, congestion, headache since Wed.  Patient had negative strep test.  Patient was treated for URI.  Patient here today due to having redness and drainage from both eyes that started yesterday.  Patient unsure of fevers.  Patient does not want her vision checked today.

## 2022-11-27 NOTE — Discharge Instructions (Addendum)
Today you being treated for bacterial conjunctivitis.   Place one drop of polytrim into the effected eye every 4 hours while awake for 7 days. If the other eye starts to have symptoms you may use medication in it as well. Do not allow tip of dropper to touch eye. May use cool compress for comfort and to remove discharge if present. Pat the eye, do not wipe.  If wearing contacts, dispose of current pair. Wear glasses until symptoms have resolved.   Do not rub eyes, this may cause more irritation.  May use benadryl as needed to help if itching present.  Please avoid use of eye makeup until symptoms clear.  Today you are being treated for an infection of the eardrum  Take amoxicillin twice daily for 10 days, you should begin to see improvement after 48 hours of medication use and then it should progressively get better  You may use Tylenol or ibuprofen for management of discomfort  May hold warm compresses to the ear for additional comfort  Please not attempted any ear cleaning or object or fluid placement into the ear canal to prevent further irritation   If symptoms persist after use of medication, please follow up at Urgent Care or with ophthalmologist (eye doctor)

## 2022-11-27 NOTE — ED Provider Notes (Signed)
[facility]    CSN: [id number] Arrival date & time: 11/27/22  1218      History   Chief Complaint Chief Complaint  Patient presents with   Eye Problem    bilateral    HPI Rebecca Cobb is a 63 y.o. female.   Patient presents for evaluation of bilateral erythema, mild pruritus and drainage beginning 1 day ago.  Endorses 5 days ago she began to have nasal congestion, rhinorrhea productive cough with foul tasting mucus , bilateral ear pains, chills and headaches productive cough.  Tolerating food and liquids.  Was evaluated in urgent care 2 days ago, diagnosed with viral illness, prescribed Tessalon affected.  Symptoms have progressively worsened.  Denies shortness of breath or wheezing significantly,     Past Medical History:  Diagnosis Date   Arthritis    fingers   Cancer ([place])    skin ca - face, upper chest   Claustrophobia    severe,Caused by laying flat or "things on face", panic attacks   Claustrophobia    Depression    no meds for 6 years   GERD (gastroesophageal reflux disease)    no meds   Headache    occasional now   Hyperlipidemia    Motion sickness    car - passenger   Motion sickness    in vehicle   Multiple skin nodules    FATTY DEPOSITS   Seasonal allergies     Patient Active Problem List   Diagnosis Date Noted   Polyp of ascending colon    Dysplastic nevus 02/21/2017   History of colon polyps 02/21/2017   Special screening for malignant neoplasms, colon    Rectal polyp    Benign neoplasm of cecum    Blood pressure elevated 07/29/2015   Incomplete tear of left rotator cuff 01/21/2015   Impingement syndrome of left shoulder 10/08/2014   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   Hypercholesterolemia 03/15/2011   Obesity (BMI 30.0-34.9) 03/15/2011    Past Surgical History:  Procedure Laterality Date   BREAST BIOPSY Right    neg   BREAST CYST EXCISION Left    BENIGN-marker in breast   CESAREAN SECTION     X3    COLONOSCOPY WITH PROPOFOL N/A 11/29/2016   Procedure: COLONOSCOPY WITH PROPOFOL;  Surgeon: Lucilla Lame, MD;  Location: [facility];  Service: Endoscopy;  Laterality: N/A;   COLONOSCOPY WITH PROPOFOL N/A 02/07/2020   Procedure: COLONOSCOPY WITH PROPOFOL, POLYPECTOMY;  Surgeon: Lucilla Lame, MD;  Location: [place];  Service: Endoscopy;  Laterality: N/A;   DILATION AND CURETTAGE OF UTERUS     liver cysts  08/29/2018   benigh, removed by surgery laparoscopy at [place] N/A 09/23/2014   Procedure: MRI;  Surgeon: Medication Radiologist, MD;  Location: [place];  Service: Radiology;  Laterality: N/A;   RADIOLOGY WITH ANESTHESIA Left 01/08/2015   Procedure: RADIOLOGY WITH ANESTHESIA/LEFT SHOULDER MRI;  Surgeon: Medication Radiologist, MD;  Location: [place];  Service: Radiology;  Laterality: Left;  DR. Lahoma Rocker   RADIOLOGY WITH ANESTHESIA Left 11/17/2015   Procedure: LEFT SHOULDER MRI;  Surgeon: Medication Radiologist, MD;  Location: [place];  Service: Radiology;  Laterality: Left;   RADIOLOGY WITH ANESTHESIA N/A 10/04/2022   Procedure: MRI WITH ANESTHESIA OF BRAIN WITH AND WITHOUT CONTRAST;  Surgeon: Radiologist, Medication, MD;  Location: [place];  Service: Radiology;  Laterality: N/A;   SHOULDER ARTHROSCOPY WITH OPEN ROTATOR CUFF REPAIR AND DISTAL CLAVICLE ACROMINECTOMY Left 03/06/2015  Procedure: SHOULDER ARTHROSCOPY WITH RELEASE OF LONG HEAD OF BICEPS TENDON AND DECOMPRESSION;  Surgeon: Leanor Kail, MD;  Location: [place];  Service: Orthopedics;  Laterality: Left;   WISDOM TOOTH EXTRACTION      OB History   No obstetric history on file.      Home Medications    Prior to Admission medications   Medication Sig Start Date End Date Taking? Authorizing Provider  acetaminophen (TYLENOL) 500 MG tablet Take 500-1,000 mg by mouth every 6 (six) hours as needed (pain).     [provider]  atorvastatin (LIPITOR) 20 MG tablet Take 20 mg by mouth  every morning. 07/13/14   [provider]  benzonatate (TESSALON) 100 MG capsule Take 1 capsule (100 mg total) by mouth every 8 (eight) hours. 11/25/22   Soots, Leitha Schuller, NP  ipratropium (ATROVENT) 0.03 % nasal spray Place 2 sprays into both nostrils every 12 (twelve) hours. 11/25/22   Hans Eden, NP  Multiple Vitamin (MULTIVITAMIN) tablet Take 1 tablet by mouth every morning.    [provider]  sertraline (ZOLOFT) 100 MG tablet Take 100 mg by mouth every morning.    [provider]  traZODone (DESYREL) 50 MG tablet Take 100 mg by mouth at bedtime. 08/31/20   [provider]    Family History Family History  Problem Relation Age of Onset   Breast cancer Neg Hx     Social History Social History   Tobacco Use   Smoking status: Former    Packs/day: 0.25    Years: 20.00    Additional pack years: 0.00    Total pack years: 5.00    Types: Cigarettes    Quit date: 04/06/2006    Years since quitting: 16.6   Smokeless tobacco: Never  Vaping Use   Vaping Use: Never used  Substance Use Topics   Alcohol use: Yes    Alcohol/week: 5.0 - 6.0 standard drinks of alcohol    Types: 5 - 6 Cans of beer per week    Comment: social   Drug use: No     Allergies   Codeine, Lactose, Lactose intolerance (gi), Other, Tape, and Celecoxib   Review of Systems Review of Systems   Physical Exam Triage Vital Signs ED Triage Vitals  Enc Vitals Group     BP 11/27/22 1248 128/74     Pulse Rate 11/27/22 1248 86     Resp 11/27/22 1248 14     Temp 11/27/22 1248 99.1 F (37.3 C)     Temp Source 11/27/22 1248 Oral     SpO2 11/27/22 1248 97 %     Weight 11/27/22 1247 177 lb (80.3 kg)     Height 11/27/22 1247 5\' 4"  (1.626 m)     Head Circumference --      Peak Flow --      Pain Score 11/27/22 1246 0     Pain Loc --      Pain Edu? --      Excl. in [place]? --    No data found.  Updated Vital Signs BP 128/74 (BP Location: Right Arm)   Pulse 86   Temp 99.1  F (37.3 C) (Oral)   Resp 14   Ht 5\' 4"  (1.626 m)   Wt 177 lb (80.3 kg)   LMP  (LMP Unknown)   SpO2 97%   BMI 30.38 kg/m   Visual Acuity Right Eye Distance:   Left Eye Distance:   Bilateral Distance:  Right Eye Near:   Left Eye Near:    Bilateral Near:     Physical Exam Constitutional:      Appearance: Normal appearance.  HENT:     Head: Normocephalic.     Right Ear: Hearing, ear canal and external ear normal. Tympanic membrane is erythematous.     Left Ear: Hearing, ear canal and external ear normal. Tympanic membrane is erythematous.     Nose: Congestion and rhinorrhea present.     Mouth/Throat:     Mouth: Mucous membranes are moist.     Pharynx: No posterior oropharyngeal erythema.  Eyes:     Comments: Significant erythema present to the bilateral conjunctiva, no drainage noted on exam, vision is grossly intact, extraocular movements intact  Pulmonary:     Effort: Pulmonary effort is normal.  Skin:    General: Skin is warm and dry.  Neurological:     Mental Status: She is alert and oriented to person, place, and time. Mental status is at baseline.      UC Treatments / Results  Labs (all labs ordered are listed, but only abnormal results are displayed) Labs Reviewed - No data to display  EKG   Radiology No results found.  Procedures Procedures (including critical care time)  Medications Ordered in UC Medications - No data to display  Initial Impression / Assessment and Plan / UC Course  I have reviewed the triage vital signs and the nursing notes.  Pertinent labs & imaging results that were available during my care of the patient were reviewed by me and considered in my medical decision making (see chart for details).  Acute upper respiratory infection, bacterial conjunctivitis of both eyes, nonrecurrent serous otitis media of both ears  Was evaluated by this provider 2 days ago, based on presentation patient is more ill-appearing, now having  significant erythema to the bilateral tympanic membranes without abnormalities to the canal or external ear, eyes are significantly red and irritated, cough witnessed in exam room is much harsher and congested, lungs are clear ,based on the presentation we will provide antibiotic, amoxicillin prescribed as well as Polytrim, discussed administration, discussed supportive measures and advised follow-up if symptoms continue to persist or worsen Final Clinical Impressions(s) / UC Diagnoses   Final diagnoses:  Bacterial conjunctivitis of both eyes     Discharge Instructions      Today you being treated for bacterial conjunctivitis.   Place one drop of polytrim into the effected eye every 4 hours while awake for 7 days. If the other eye starts to have symptoms you may use medication in it as well. Do not allow tip of dropper to touch eye. May use cool compress for comfort and to remove discharge if present. Pat the eye, do not wipe.  If wearing contacts, dispose of current pair. Wear glasses until symptoms have resolved.   Do not rub eyes, this may cause more irritation.  May use benadryl as needed to help if itching present.  Please avoid use of eye makeup until symptoms clear.  If symptoms persist after use of medication, please follow up at Urgent Care or with ophthalmologist (eye doctor)    ED Prescriptions   None    PDMP not reviewed this encounter.   Itzel, Konicki, NP 11/27/22 1347

## 2023-06-06 ENCOUNTER — Ambulatory Visit: Payer: Self-pay

## 2023-06-08 ENCOUNTER — Telehealth: Payer: Self-pay | Admitting: Gastroenterology

## 2023-06-08 NOTE — Telephone Encounter (Signed)
Patient called in because she stated that she has a miss call from [place]. I informed her that it had to be an error.

## 2023-08-14 ENCOUNTER — Other Ambulatory Visit: Payer: Self-pay | Admitting: Family Medicine

## 2023-08-14 DIAGNOSIS — Z1231 Encounter for screening mammogram for malignant neoplasm of breast: Secondary | ICD-10-CM

## 2023-08-25 ENCOUNTER — Ambulatory Visit
Admission: RE | Admit: 2023-08-25 | Discharge: 2023-08-25 | Disposition: A | Discharge status: Home / Self Care | Payer: Self-pay | Source: Ambulatory Visit | Attending: Family Medicine | Admitting: Family Medicine

## 2023-08-25 ENCOUNTER — Ambulatory Visit: Payer: Self-pay

## 2023-08-25 VITALS — BP 137/74 | HR 70 | Temp 98.5°F | Resp 14 | Ht 64.0 in | Wt 180.0 lb

## 2023-08-25 DIAGNOSIS — J40 Bronchitis, not specified as acute or chronic: Secondary | ICD-10-CM

## 2023-08-25 DIAGNOSIS — Z87891 Personal history of nicotine dependence: Secondary | ICD-10-CM

## 2023-08-25 MED ORDER — CEFDINIR 300 MG PO CAPS: 300.0000 mg | ORAL_CAPSULE | Freq: Two times a day (BID) | ORAL | 0 refills | Status: DC

## 2023-08-25 MED ORDER — AMOXICILLIN-POT CLAVULANATE 875-125 MG PO TABS
1.0000 | ORAL_TABLET | Freq: Two times a day (BID) | ORAL | 0 refills | Status: DC
Start: 2023-08-25 — End: 2023-08-25

## 2023-08-25 MED ORDER — AZITHROMYCIN 250 MG PO TABS: ORAL_TABLET | ORAL | 0 refills | Status: AC

## 2023-08-25 MED ORDER — HYDROCOD POLI-CHLORPHE POLI ER 10-8 MG/5ML PO SUER: 5.0000 mL | Freq: Two times a day (BID) | ORAL | 0 refills | Status: AC | PRN

## 2023-08-25 NOTE — ED Provider Notes (Addendum)
[facility]    CSN: [id number] Arrival date & time: 08/25/23  1343      History   Chief Complaint Chief Complaint  Patient presents with   Cough    Appointment    HPI Rebecca Cobb is a 62 y.o. female.   HPI  History obtained from the patient. Rebecca Cobb presents for cough for the past 2 weeks and the last 4 days she has not been sleep at night. She has some urinary incontinence due to the cough. Has chills and feeling feverish.  No documented fever. She hasn't taken anything for her cough as she wasn't sure what she could mix with her medications.    No history of asthma. She quit smoking in 2007.        Past Medical History:  Diagnosis Date   Arthritis    fingers   Cancer (HCC)    skin ca - face, upper chest   Claustrophobia    severe,Caused by laying flat or "things on face", panic attacks   Claustrophobia    Depression    no meds for 6 years   GERD (gastroesophageal reflux disease)    no meds   Headache    occasional now   Hyperlipidemia    Motion sickness    car - passenger   Motion sickness    in vehicle   Multiple skin nodules    FATTY DEPOSITS   Seasonal allergies     Patient Active Problem List   Diagnosis Date Noted   Polyp of ascending colon    Dysplastic nevus 02/21/2017   History of colon polyps 02/21/2017   Special screening for malignant neoplasms, colon    Rectal polyp    Benign neoplasm of cecum    Blood pressure elevated 07/29/2015   Incomplete tear of left rotator cuff 01/21/2015   Impingement syndrome of left shoulder 10/08/2014   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   Hypercholesterolemia 03/15/2011   Obesity (BMI 30.0-34.9) 03/15/2011    Past Surgical History:  Procedure Laterality Date   BREAST BIOPSY Right    neg   BREAST CYST EXCISION Left    BENIGN-marker in breast   CESAREAN SECTION     X3   COLONOSCOPY WITH PROPOFOL N/A 11/29/2016   Procedure: COLONOSCOPY WITH PROPOFOL;  Surgeon: Midge Minium, MD;  Location: [facility];  Service: Endoscopy;  Laterality: N/A;   COLONOSCOPY WITH PROPOFOL N/A 02/07/2020   Procedure: COLONOSCOPY WITH PROPOFOL, POLYPECTOMY;  Surgeon: Midge Minium, MD;  Location: [facility];  Service: Endoscopy;  Laterality: N/A;   DILATION AND CURETTAGE OF UTERUS     liver cysts  08/29/2018   benigh, removed by surgery laparoscopy at [facility]   RADIOLOGY WITH ANESTHESIA N/A 09/23/2014   Procedure: MRI;  Surgeon: Medication Radiologist, MD;  Location: [facility];  Service: Radiology;  Laterality: N/A;   RADIOLOGY WITH ANESTHESIA Left 01/08/2015   Procedure: RADIOLOGY WITH ANESTHESIA/LEFT SHOULDER MRI;  Surgeon: Medication Radiologist, MD;  Location: [facility];  Service: Radiology;  Laterality: Left;  DR. Darrel Reach   RADIOLOGY WITH ANESTHESIA Left 11/17/2015   Procedure: LEFT SHOULDER MRI;  Surgeon: Medication Radiologist, MD;  Location: [facility];  Service: Radiology;  Laterality: Left;   RADIOLOGY WITH ANESTHESIA N/A 10/04/2022   Procedure: MRI WITH ANESTHESIA OF BRAIN WITH AND WITHOUT CONTRAST;  Surgeon: Radiologist, Medication, MD;  Location: [facility];  Service: Radiology;  Laterality: N/A;   SHOULDER ARTHROSCOPY WITH OPEN ROTATOR CUFF REPAIR AND DISTAL CLAVICLE ACROMINECTOMY Left  03/06/2015   Procedure: SHOULDER ARTHROSCOPY WITH RELEASE OF LONG HEAD OF BICEPS TENDON AND DECOMPRESSION;  Surgeon: Erin Sons, MD;  Location: [facility];  Service: Orthopedics;  Laterality: Left;   WISDOM TOOTH EXTRACTION      OB History   No obstetric history on file.      Home Medications    Prior to Admission medications   Medication Sig Start Date End Date Taking? Authorizing Provider  atorvastatin (LIPITOR) 20 MG tablet Take 20 mg by mouth every morning. 07/13/14  Yes [provider]  azithromycin (ZITHROMAX Z-PAK) 250 MG tablet Take 2 tablets on day 1 then 1 tablet daily 08/25/23  Yes Berthel Bagnall, DO  cefdinir (OMNICEF) 300 MG capsule Take 1 capsule (300 mg  total) by mouth 2 (two) times daily. 08/25/23  Yes Kimbella Heisler, DO  chlorpheniramine-HYDROcodone (TUSSIONEX) 10-8 MG/5ML Take 5 mLs by mouth every 12 (twelve) hours as needed. 08/25/23  Yes Stephaun Million, DO  sertraline (ZOLOFT) 100 MG tablet Take 100 mg by mouth every morning.   Yes [provider]  traZODone (DESYREL) 50 MG tablet Take 100 mg by mouth at bedtime. 08/31/20  Yes [provider]  acetaminophen (TYLENOL) 500 MG tablet Take 500-1,000 mg by mouth every 6 (six) hours as needed (pain).     [provider]  benzonatate (TESSALON) 100 MG capsule Take 1 capsule (100 mg total) by mouth every 8 (eight) hours. 11/25/22   Valinda Hoar, NP  Multiple Vitamin (MULTIVITAMIN) tablet Take 1 tablet by mouth every morning.    [provider]    Family History Family History  Problem Relation Age of Onset   Breast cancer Neg Hx     Social History Social History   Tobacco Use   Smoking status: Former    Current packs/day: 0.00    Average packs/day: 0.3 packs/day for 20.0 years (5.0 ttl pk-yrs)    Types: Cigarettes    Start date: 04/06/1986    Quit date: 04/06/2006    Years since quitting: 17.3   Smokeless tobacco: Never  Vaping Use   Vaping status: Never Used  Substance Use Topics   Alcohol use: Yes    Alcohol/week: 5.0 - 6.0 standard drinks of alcohol    Types: 5 - 6 Cans of beer per week    Comment: social   Drug use: No     Allergies   Codeine, Lactose, Lactose intolerance (gi), Other, Tape, Amoxicillin, and Celecoxib   Review of Systems Review of Systems: negative unless otherwise stated in HPI.      Physical Exam Triage Vital Signs ED Triage Vitals  Encounter Vitals Group     BP 08/25/23 1357 137/74     Systolic BP Percentile --      Diastolic BP Percentile --      Pulse Rate 08/25/23 1357 70     Resp 08/25/23 1357 14     Temp 08/25/23 1357 98.5 F (36.9 C)     Temp Source 08/25/23 1357 Oral     SpO2 08/25/23 1357  97 %     Weight 08/25/23 1355 180 lb (81.6 kg)     Height 08/25/23 1355 5\' 4"  (1.626 m)     Head Circumference --      Peak Flow --      Pain Score 08/25/23 1355 5     Pain Loc --      Pain Education --      Exclude from Growth Chart --  No data found.  Updated Vital Signs BP 137/74 (BP Location: Left Arm)   Pulse 70   Temp 98.5 F (36.9 C) (Oral)   Resp 14   Ht 5\' 4"  (1.626 m)   Wt 81.6 kg   LMP  (LMP Unknown)   SpO2 97%   BMI 30.90 kg/m   Visual Acuity Right Eye Distance:   Left Eye Distance:   Bilateral Distance:    Right Eye Near:   Left Eye Near:    Bilateral Near:     Physical Exam GEN:     alert, non-toxic appearing female in no distress    HENT:  mucus membranes moist, no nasal discharge RESP:  no increased work of breathing, coarse breathe sounds bilaterally, faint expiratory wheezing  CVS:   regular rate and rhythm Skin:   warm and dry    UC Treatments / Results  Labs (all labs ordered are listed, but only abnormal results are displayed) Labs Reviewed - No data to display  EKG   Radiology DG Chest 2 View Result Date: 08/25/2023 CLINICAL DATA:  Cough and chest congestion for 2 weeks.  Chills. EXAM: CHEST - 2 VIEW COMPARISON:  08/02/2021 FINDINGS: The heart size and mediastinal contours are within normal limits. Mild bilateral pleural-parenchymal scarring is stable. The lungs are otherwise clear. The visualized skeletal structures are unremarkable. IMPRESSION: Stable exam.  No active cardiopulmonary disease. Electronically Signed   By: Danae Orleans M.D.   On: 08/25/2023 15:31    Procedures Procedures (including critical care time)  Medications Ordered in UC Medications - No data to display  Initial Impression / Assessment and Plan / UC Course  I have reviewed the triage vital signs and the nursing notes.  Pertinent labs & imaging results that were available during my care of the patient were reviewed by me and considered in my medical  decision making (see chart for details).       Pt is a 62 y.o. female who presents for 2 weeks of cough that is not improving.  Johann is  afebrile here without recent antipyretics. Satting 97% on room air. Overall pt is  non-toxic appearing, well hydrated, without respiratory distress. Pulmonary exam is remarkable for faint expiratory wheezing, coarse breathe sounds bilaterally and cough.  After shared decision making, we not pursue chest x-ray at this time as it currently would not change management.  COVID  and influenza testing deferred due to length of symptoms.  Chest xray personally reviewed by me concerning for possible left lower pneumonia but no pleural effusion, cardiomegaly or pneumothorax. Patient aware the radiologist has not read her xray and is comfortable with the preliminary read by me. Will review radiologist read when available and call patient if a change in plan is warranted.  Pt agreeable to this plan prior to discharge.   Treat acute bronchitis with  antibiotics as below.  Tussionex  cough syrup given for cough and allow patient to rest.  Typical duration of symptoms discussed. Return and ED precautions given and patient voiced understanding.   Discussed MDM, treatment plan and plan for follow-up with patient who agrees with plan.   Radiologist impression reviewed and notes stable mild bilateral pleural parenchymal scarring. Pt called and updated with results and decrease to 1 antibiotic (azithromycin). Understanding voiced.    Final Clinical Impressions(s) / UC Diagnoses   Final diagnoses:  Bronchitis  Former smoker     Discharge Instructions      Your chest xray is suspicious for  pneumonia we will treat you for bronchitis. The radiologist has not yet read it, if they disagree of find something different, I will call you.    Stop by the pharmacy to pick up your prescriptions.  Follow up with your primary care provider as needed.      ED Prescriptions      Medication Sig Dispense Auth. Provider   azithromycin (ZITHROMAX Z-PAK) 250 MG tablet Take 2 tablets on day 1 then 1 tablet daily 6 tablet Raun Routh, DO   chlorpheniramine-HYDROcodone (TUSSIONEX) 10-8 MG/5ML Take 5 mLs by mouth every 12 (twelve) hours as needed. 115 mL Nashae Maudlin, DO   cefdinir (OMNICEF) 300 MG capsule (DISCONTINUED) Take 1 capsule (300 mg total) by mouth 2 (two) times daily. 10 capsule Selig Wampole, DO      I have reviewed the PDMP during this encounter.      Katha Cabal, DO 08/25/23 1557

## 2023-08-25 NOTE — ED Triage Notes (Signed)
Patient c/o cough and chest congestion that started 2 weeks ago.  Patient states that she is feeling worse.  Patient reports unable to sleep due to cough.  Patient denies fevers but reports chills.

## 2023-08-25 NOTE — Discharge Instructions (Addendum)
Your chest xray is suspicious for pneumonia we will treat you for bronchitis. The radiologist has not yet read it, if they disagree of find something different, I will call you.    Stop by the pharmacy to pick up your prescriptions.  Follow up with your primary care provider as needed.

## 2023-09-05 ENCOUNTER — Ambulatory Visit
Admission: RE | Admit: 2023-09-05 | Discharge: 2023-09-05 | Disposition: A | Discharge status: Home / Self Care | Payer: No Typology Code available for payment source | Source: Ambulatory Visit | Attending: Emergency Medicine | Admitting: Emergency Medicine

## 2023-09-05 VITALS — BP 136/81 | HR 70 | Temp 98.6°F | Resp 18

## 2023-09-05 DIAGNOSIS — R051 Acute cough: Secondary | ICD-10-CM

## 2023-09-05 DIAGNOSIS — J22 Unspecified acute lower respiratory infection: Secondary | ICD-10-CM | POA: Diagnosis not present

## 2023-09-05 MED ORDER — ALBUTEROL SULFATE HFA 108 (90 BASE) MCG/ACT IN AERS: 2.0000 | INHALATION_SPRAY | RESPIRATORY_TRACT | 0 refills | Status: AC | PRN

## 2023-09-05 MED ORDER — DOXYCYCLINE HYCLATE 100 MG PO CAPS: 100.0000 mg | ORAL_CAPSULE | Freq: Two times a day (BID) | ORAL | 0 refills | Status: AC

## 2023-09-05 MED ORDER — BENZONATATE 100 MG PO CAPS: 100.0000 mg | ORAL_CAPSULE | Freq: Three times a day (TID) | ORAL | 0 refills | Status: AC

## 2023-09-05 MED ORDER — ALBUTEROL SULFATE HFA 108 (90 BASE) MCG/ACT IN AERS
2.0000 | INHALATION_SPRAY | RESPIRATORY_TRACT | 0 refills | Status: DC | PRN
Start: 2023-09-05 — End: 2023-09-05

## 2023-09-05 NOTE — Discharge Instructions (Signed)
Drink plenty of water. Take meds as directed. Follow up with PCP. If you have new or worsening issues (chest pain, shortness of breath, go to ER for further evaluation).

## 2023-09-05 NOTE — ED Triage Notes (Signed)
Pt present cough with congestion, symptoms started two weeks ago, pt states symptoms has gotten worst instead of better.

## 2023-09-05 NOTE — ED Provider Notes (Signed)
[facility]    CSN: [id number] Arrival date & time: 09/05/23  1203      History   Chief Complaint Chief Complaint  Patient presents with   Cough    Entered by patient    HPI Rebecca Cobb is a 62 y.o. female.   62 year old female pt, Rebecca Cobb, presents to urgent care for evaluation of worsening cough x 3 weeks.  Patient states her symptoms have not improved after taking Z-Pak and Tussionex.  Patient recently had chest x-ray that did not show any pneumonia old scarring only.  Patient states that her coughs are so bad that she has coughing fits and causes shortness of breath at that time , patient states she has not been able to get appoint with her PCP and is going out of town and wants to be rechecked.   The history is provided by the patient and the spouse. No language interpreter was used.    Past Medical History:  Diagnosis Date   Arthritis    fingers   Cancer (HCC)    skin ca - face, upper chest   Claustrophobia    severe,Caused by laying flat or "things on face", panic attacks   Claustrophobia    Depression    no meds for 6 years   GERD (gastroesophageal reflux disease)    no meds   Headache    occasional now   Hyperlipidemia    Motion sickness    car - passenger   Motion sickness    in vehicle   Multiple skin nodules    FATTY DEPOSITS   Seasonal allergies     Patient Active Problem List   Diagnosis Date Noted   Acute respiratory infection 09/05/2023   Acute cough 09/05/2023   Polyp of ascending colon    Dysplastic nevus 02/21/2017   History of colon polyps 02/21/2017   Special screening for malignant neoplasms, colon    Rectal polyp    Benign neoplasm of cecum    Blood pressure elevated 07/29/2015   Incomplete tear of left rotator cuff 01/21/2015   Impingement syndrome of left shoulder 10/08/2014   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   Hypercholesterolemia 03/15/2011   Obesity (BMI 30.0-34.9) 03/15/2011    Past  Surgical History:  Procedure Laterality Date   BREAST BIOPSY Right    neg   BREAST CYST EXCISION Left    BENIGN-marker in breast   CESAREAN SECTION     X3   COLONOSCOPY WITH PROPOFOL N/A 11/29/2016   Procedure: COLONOSCOPY WITH PROPOFOL;  Surgeon: Midge Minium, MD;  Location: [facility];  Service: Endoscopy;  Laterality: N/A;   COLONOSCOPY WITH PROPOFOL N/A 02/07/2020   Procedure: COLONOSCOPY WITH PROPOFOL, POLYPECTOMY;  Surgeon: Midge Minium, MD;  Location: [facility];  Service: Endoscopy;  Laterality: N/A;   DILATION AND CURETTAGE OF UTERUS     liver cysts  08/29/2018   benigh, removed by surgery laparoscopy at [facility]   RADIOLOGY WITH ANESTHESIA N/A 09/23/2014   Procedure: MRI;  Surgeon: Medication Radiologist, MD;  Location: [facility];  Service: Radiology;  Laterality: N/A;   RADIOLOGY WITH ANESTHESIA Left 01/08/2015   Procedure: RADIOLOGY WITH ANESTHESIA/LEFT SHOULDER MRI;  Surgeon: Medication Radiologist, MD;  Location: [facility];  Service: Radiology;  Laterality: Left;  DR. Darrel Reach   RADIOLOGY WITH ANESTHESIA Left 11/17/2015   Procedure: LEFT SHOULDER MRI;  Surgeon: Medication Radiologist, MD;  Location: [facility];  Service: Radiology;  Laterality: Left;   RADIOLOGY WITH  ANESTHESIA N/A 10/04/2022   Procedure: MRI WITH ANESTHESIA OF BRAIN WITH AND WITHOUT CONTRAST;  Surgeon: Radiologist, Medication, MD;  Location: [facility];  Service: Radiology;  Laterality: N/A;   SHOULDER ARTHROSCOPY WITH OPEN ROTATOR CUFF REPAIR AND DISTAL CLAVICLE ACROMINECTOMY Left 03/06/2015   Procedure: SHOULDER ARTHROSCOPY WITH RELEASE OF LONG HEAD OF BICEPS TENDON AND DECOMPRESSION;  Surgeon: Erin Sons, MD;  Location: [facility];  Service: Orthopedics;  Laterality: Left;   WISDOM TOOTH EXTRACTION      OB History   No obstetric history on file.      Home Medications    Prior to Admission medications   Medication Sig Start Date End Date Taking? Authorizing Provider  albuterol (VENTOLIN HFA) 108  (90 Base) MCG/ACT inhaler Inhale 2 puffs into the lungs every 4 (four) hours as needed for wheezing or shortness of breath. 09/05/23  Yes Gavriela Cashin, Para March, NP  doxycycline (VIBRAMYCIN) 100 MG capsule Take 1 capsule (100 mg total) by mouth 2 (two) times daily for 7 days. 09/05/23 09/12/23 Yes Izekiel Flegel, Para March, NP  acetaminophen (TYLENOL) 500 MG tablet Take 500-1,000 mg by mouth every 6 (six) hours as needed (pain).     [provider]  atorvastatin (LIPITOR) 20 MG tablet Take 20 mg by mouth every morning. 07/13/14   [provider]  azithromycin (ZITHROMAX Z-PAK) 250 MG tablet Take 2 tablets on day 1 then 1 tablet daily 08/25/23   Brimage, Vondra, DO  benzonatate (TESSALON) 100 MG capsule Take 1 capsule (100 mg total) by mouth every 8 (eight) hours for 3 days. 09/05/23 09/08/23  Detravion Tester, Para March, NP  chlorpheniramine-HYDROcodone (TUSSIONEX) 10-8 MG/5ML Take 5 mLs by mouth every 12 (twelve) hours as needed. 08/25/23   Katha Cabal, DO  Multiple Vitamin (MULTIVITAMIN) tablet Take 1 tablet by mouth every morning.    [provider]  sertraline (ZOLOFT) 100 MG tablet Take 100 mg by mouth every morning.    [provider]  traZODone (DESYREL) 50 MG tablet Take 100 mg by mouth at bedtime. 08/31/20   [provider]    Family History Family History  Problem Relation Age of Onset   Breast cancer Neg Hx     Social History Social History   Tobacco Use   Smoking status: Former    Current packs/day: 0.00    Average packs/day: 0.3 packs/day for 20.0 years (5.0 ttl pk-yrs)    Types: Cigarettes    Start date: 04/06/1986    Quit date: 04/06/2006    Years since quitting: 17.4   Smokeless tobacco: Never  Vaping Use   Vaping status: Never Used  Substance Use Topics   Alcohol use: Yes    Alcohol/week: 5.0 - 6.0 standard drinks of alcohol    Types: 5 - 6 Cans of beer per week    Comment: social   Drug use: No     Allergies   Codeine, Lactose,  Lactose intolerance (gi), Other, Tape, Amoxicillin, and Celecoxib   Review of Systems Review of Systems  Constitutional:  Negative for fever.  Respiratory:  Positive for cough and shortness of breath. Negative for wheezing.   Cardiovascular:  Negative for chest pain and palpitations.  All other systems reviewed and are negative.    Physical Exam Triage Vital Signs ED Triage Vitals  Encounter Vitals Group     BP 09/05/23 1308 136/81     Systolic BP Percentile --      Diastolic BP Percentile --      Pulse Rate 09/05/23 1308  70     Resp 09/05/23 1308 18     Temp 09/05/23 1308 98.6 F (37 C)     Temp Source 09/05/23 1308 Oral     SpO2 09/05/23 1308 96 %     Weight --      Height --      Head Circumference --      Peak Flow --      Pain Score 09/05/23 1307 0     Pain Loc --      Pain Education --      Exclude from Growth Chart --    No data found.  Updated Vital Signs BP 136/81 (BP Location: Right Arm)   Pulse 70   Temp 98.6 F (37 C) (Oral)   Resp 18   LMP  (LMP Unknown)   SpO2 96%   Visual Acuity Right Eye Distance:   Left Eye Distance:   Bilateral Distance:    Right Eye Near:   Left Eye Near:    Bilateral Near:     Physical Exam Vitals and nursing note reviewed.  Constitutional:      General: She is not in acute distress.    Appearance: She is well-developed.  HENT:     Head: Normocephalic.     Right Ear: Tympanic membrane is retracted.     Left Ear: Tympanic membrane is retracted.     Nose: Congestion present.     Mouth/Throat:     Lips: Pink.     Mouth: Mucous membranes are moist.     Pharynx: Oropharynx is clear.  Eyes:     General: Lids are normal.     Conjunctiva/sclera: Conjunctivae normal.     Pupils: Pupils are equal, round, and reactive to light.  Neck:     Trachea: No tracheal deviation.  Cardiovascular:     Rate and Rhythm: Normal rate and regular rhythm.     Pulses: Normal pulses.     Heart sounds: Normal heart sounds. No murmur  heard. Pulmonary:     Effort: Pulmonary effort is normal.     Breath sounds: Normal breath sounds and air entry.  Abdominal:     General: Bowel sounds are normal.     Palpations: Abdomen is soft.     Tenderness: There is no abdominal tenderness.  Musculoskeletal:        General: Normal range of motion.     Cervical back: Normal range of motion.  Lymphadenopathy:     Cervical: No cervical adenopathy.  Skin:    General: Skin is warm and dry.     Findings: No rash.  Neurological:     General: No focal deficit present.     Mental Status: She is alert and oriented to person, place, and time.     GCS: GCS eye subscore is 4. GCS verbal subscore is 5. GCS motor subscore is 6.  Psychiatric:        Attention and Perception: Attention normal.        Mood and Affect: Mood normal.        Speech: Speech normal.        Behavior: Behavior normal. Behavior is cooperative.      UC Treatments / Results  Labs (all labs ordered are listed, but only abnormal results are displayed) Labs Reviewed - No data to display  EKG   Radiology No results found.  Procedures Procedures (including critical care time)  Medications Ordered in UC Medications - No data to display  Initial Impression / Assessment and Plan / UC Course  I have reviewed the triage vital signs and the nursing notes.  Pertinent labs & imaging results that were available during my care of the patient were reviewed by me and considered in my medical decision making (see chart for details).    Discussed exam findings and plan of care with patient, will prescription albuterol, Tessalon Perles, doxycycline, strict go to ER precautions given, patient verbalized understanding to this provider.   Ddx: Acute respiratory infection, acute cough, viral illness, allergies Final Clinical Impressions(s) / UC Diagnoses   Final diagnoses:  Acute respiratory infection  Acute cough     Discharge Instructions      Drink plenty of  water. Take meds as directed. Follow up with PCP. If you have new or worsening issues (chest pain, shortness of breath, go to ER for further evaluation).     ED Prescriptions     Medication Sig Dispense Auth. Provider   albuterol (VENTOLIN HFA) 108 (90 Base) MCG/ACT inhaler Inhale 2 puffs into the lungs every 4 (four) hours as needed for wheezing or shortness of breath. 1 each Emmalyn Hinson, Para March, NP   benzonatate (TESSALON) 100 MG capsule Take 1 capsule (100 mg total) by mouth every 8 (eight) hours for 3 days. 9 capsule Leola Fiore, NP   doxycycline (VIBRAMYCIN) 100 MG capsule Take 1 capsule (100 mg total) by mouth 2 (two) times daily for 7 days. 14 capsule Aubery Douthat, Para March, NP      PDMP not reviewed this encounter.   Clancy Gourd, NP 09/05/23 1335

## 2023-09-19 ENCOUNTER — Ambulatory Visit
Admission: RE | Admit: 2023-09-19 | Discharge: 2023-09-19 | Disposition: A | Discharge status: Home / Self Care | Payer: 59 | Source: Ambulatory Visit | Attending: Family Medicine | Admitting: Family Medicine

## 2023-09-19 DIAGNOSIS — Z1231 Encounter for screening mammogram for malignant neoplasm of breast: Secondary | ICD-10-CM | POA: Diagnosis not present

## 2023-09-21 ENCOUNTER — Other Ambulatory Visit: Payer: Self-pay | Admitting: Family Medicine

## 2023-09-21 DIAGNOSIS — R928 Other abnormal and inconclusive findings on diagnostic imaging of breast: Secondary | ICD-10-CM

## 2023-09-27 ENCOUNTER — Ambulatory Visit
Admission: RE | Admit: 2023-09-27 | Discharge: 2023-09-27 | Disposition: A | Discharge status: Home / Self Care | Payer: 59 | Source: Ambulatory Visit | Attending: Family Medicine | Admitting: Family Medicine

## 2023-09-27 DIAGNOSIS — N6315 Unspecified lump in the right breast, overlapping quadrants: Secondary | ICD-10-CM | POA: Diagnosis not present

## 2023-09-27 DIAGNOSIS — R928 Other abnormal and inconclusive findings on diagnostic imaging of breast: Secondary | ICD-10-CM

## 2023-09-27 DIAGNOSIS — R92321 Mammographic fibroglandular density, right breast: Secondary | ICD-10-CM | POA: Diagnosis not present

## 2023-09-27 DIAGNOSIS — N6314 Unspecified lump in the right breast, lower inner quadrant: Secondary | ICD-10-CM | POA: Diagnosis not present

## 2023-10-11 DIAGNOSIS — Z124 Encounter for screening for malignant neoplasm of cervix: Secondary | ICD-10-CM | POA: Diagnosis not present

## 2023-10-11 DIAGNOSIS — G8929 Other chronic pain: Secondary | ICD-10-CM | POA: Diagnosis not present

## 2023-10-11 DIAGNOSIS — Z1211 Encounter for screening for malignant neoplasm of colon: Secondary | ICD-10-CM | POA: Diagnosis not present

## 2023-10-11 DIAGNOSIS — R051 Acute cough: Secondary | ICD-10-CM | POA: Diagnosis not present

## 2023-10-11 DIAGNOSIS — Z23 Encounter for immunization: Secondary | ICD-10-CM | POA: Diagnosis not present

## 2023-10-11 DIAGNOSIS — Z0001 Encounter for general adult medical examination with abnormal findings: Secondary | ICD-10-CM | POA: Diagnosis not present

## 2023-10-11 DIAGNOSIS — Z1331 Encounter for screening for depression: Secondary | ICD-10-CM | POA: Diagnosis not present

## 2023-10-11 DIAGNOSIS — E7800 Pure hypercholesterolemia, unspecified: Secondary | ICD-10-CM | POA: Diagnosis not present

## 2023-10-11 DIAGNOSIS — F331 Major depressive disorder, recurrent, moderate: Secondary | ICD-10-CM | POA: Diagnosis not present

## 2023-10-11 DIAGNOSIS — M19012 Primary osteoarthritis, left shoulder: Secondary | ICD-10-CM | POA: Diagnosis not present

## 2023-10-11 DIAGNOSIS — M25512 Pain in left shoulder: Secondary | ICD-10-CM | POA: Diagnosis not present

## 2023-11-06 DIAGNOSIS — H9012 Conductive hearing loss, unilateral, left ear, with unrestricted hearing on the contralateral side: Secondary | ICD-10-CM | POA: Diagnosis not present

## 2023-11-06 DIAGNOSIS — Z23 Encounter for immunization: Secondary | ICD-10-CM | POA: Diagnosis not present

## 2024-02-22 ENCOUNTER — Encounter: Payer: Self-pay | Admitting: Family Medicine

## 2024-02-26 ENCOUNTER — Other Ambulatory Visit: Payer: Self-pay | Admitting: Family Medicine

## 2024-02-26 DIAGNOSIS — N63 Unspecified lump in unspecified breast: Secondary | ICD-10-CM

## 2024-04-08 ENCOUNTER — Ambulatory Visit
Admission: RE | Admit: 2024-04-08 | Discharge: 2024-04-08 | Disposition: A | Discharge status: Home / Self Care | Source: Ambulatory Visit | Attending: Family Medicine | Admitting: Family Medicine

## 2024-04-08 DIAGNOSIS — N63 Unspecified lump in unspecified breast: Secondary | ICD-10-CM

## 2024-09-19 ENCOUNTER — Other Ambulatory Visit: Payer: Self-pay | Admitting: Family Medicine

## 2024-09-19 DIAGNOSIS — N63 Unspecified lump in unspecified breast: Secondary | ICD-10-CM

## 2024-10-10 ENCOUNTER — Ambulatory Visit
Admission: RE | Admit: 2024-10-10 | Discharge: 2024-10-10 | Disposition: A | Discharge status: Home / Self Care | Payer: PRIVATE HEALTH INSURANCE | Source: Ambulatory Visit | Attending: Family Medicine | Admitting: Family Medicine

## 2024-10-10 DIAGNOSIS — N63 Unspecified lump in unspecified breast: Secondary | ICD-10-CM | POA: Diagnosis present
